# Patient Record
Sex: Female | Born: 1990 | Race: White | Hispanic: No | Marital: Single | Smoking: Former smoker
Health system: Southern US, Community
[De-identification: ages and names within clinical notes are randomized; demographics above are authoritative.]

## PROBLEM LIST (undated history)

## (undated) DIAGNOSIS — B009 Herpesviral infection, unspecified: Secondary | ICD-10-CM

## (undated) DIAGNOSIS — N730 Acute parametritis and pelvic cellulitis: Secondary | ICD-10-CM

## (undated) DIAGNOSIS — F909 Attention-deficit hyperactivity disorder, unspecified type: Secondary | ICD-10-CM

## (undated) DIAGNOSIS — R51 Headache: Secondary | ICD-10-CM

## (undated) DIAGNOSIS — R87619 Unspecified abnormal cytological findings in specimens from cervix uteri: Secondary | ICD-10-CM

## (undated) DIAGNOSIS — K8020 Calculus of gallbladder without cholecystitis without obstruction: Secondary | ICD-10-CM

## (undated) DIAGNOSIS — Z202 Contact with and (suspected) exposure to infections with a predominantly sexual mode of transmission: Secondary | ICD-10-CM

## (undated) DIAGNOSIS — R519 Headache, unspecified: Secondary | ICD-10-CM

## (undated) DIAGNOSIS — A599 Trichomoniasis, unspecified: Secondary | ICD-10-CM

## (undated) HISTORY — DX: Unspecified abnormal cytological findings in specimens from cervix uteri: R87.619

## (undated) HISTORY — DX: Headache, unspecified: R51.9

## (undated) HISTORY — DX: Contact with and (suspected) exposure to infections with a predominantly sexual mode of transmission: Z20.2

## (undated) HISTORY — DX: Headache: R51

## (undated) HISTORY — DX: Attention-deficit hyperactivity disorder, unspecified type: F90.9

## (undated) HISTORY — DX: Trichomoniasis, unspecified: A59.9

## (undated) HISTORY — DX: Calculus of gallbladder without cholecystitis without obstruction: K80.20

## (undated) HISTORY — DX: Acute parametritis and pelvic cellulitis: N73.0

## (undated) HISTORY — DX: Herpesviral infection, unspecified: B00.9

## (undated) HISTORY — PX: CLOSED REDUCTION NASAL FRACTURE: SUR256

## (undated) HISTORY — PX: WISDOM TOOTH EXTRACTION: SHX21

## (undated) HISTORY — PX: OTHER SURGICAL HISTORY: SHX169

## (undated) HISTORY — PX: HYSTEROSCOPY: SHX211

---
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
## 2012-08-23 ENCOUNTER — Encounter: Payer: Self-pay | Admitting: Certified Nurse Midwife

## 2012-08-24 ENCOUNTER — Encounter: Payer: 59 | Admitting: Certified Nurse Midwife

## 2012-08-30 ENCOUNTER — Encounter: Payer: Self-pay | Admitting: Certified Nurse Midwife

## 2012-08-30 ENCOUNTER — Ambulatory Visit (INDEPENDENT_AMBULATORY_CARE_PROVIDER_SITE_OTHER): Payer: 59 | Admitting: Certified Nurse Midwife

## 2012-08-30 VITALS — BP 104/78 | HR 68 | Resp 16 | Wt 241.0 lb

## 2012-08-30 DIAGNOSIS — N760 Acute vaginitis: Secondary | ICD-10-CM

## 2012-08-30 MED ORDER — METRONIDAZOLE 0.75 % VA GEL: 1.0000 | Freq: Every day | VAGINAL | Status: DC

## 2012-08-30 NOTE — Progress Notes (Signed)
21 y.o.Single Caucasian female G0P0000 with a 2 month history of the following:odor and vulvar itching Sexually active: yes Last sexual activity:1days ago. Pt also reports the following associated symptoms: none Patient has not tried over the counter treatment with (not applicable) relief.  No STD concerns, same partner for one year. Contraception: condoms.  Has repeated BV infections in the past two years, has decreased with monogamous relationship.  Was treated in 2-14 for BV which all symptoms resolved. Has not changed condom brand or any new personal products, limiting shaving now. No other health issues.     Exam:  ZOX:WRUEAV, Bartholin's, Urethra, Skene's normal                WUJ:[id number]: copious, watery and malodorous, pH 5.5, wet prep done                Cx:  normal appearance and non tender                Uterus:normal size, anteverted, normal shape and consistency                Adnexa: normal adnexa and no mass, fullness, tenderness  Wet Prep shows:clue cells and negative for yeast   NF:[id number] vaginosis ? Chronic Contraception: Condoms   P: Reviewed findings Discussed using baking soda to rinse with after sexual activity(external) and also using one applicator of Metrogel after menses completion each month, trial to see if no further symptoms occur. Patient would like try. Will advise after 2 months of use and treatment this time.  Stressed consistent condom use. :Vaginal antibiotic see orders. Stressed consistent condom use.  Rv prn     Reviewed, TL

## 2012-10-30 NOTE — Progress Notes (Signed)
DNKA

## 2012-12-10 ENCOUNTER — Encounter: Payer: Self-pay | Admitting: Nurse Practitioner

## 2012-12-10 ENCOUNTER — Encounter: Payer: Self-pay | Admitting: Certified Nurse Midwife

## 2012-12-10 ENCOUNTER — Ambulatory Visit (INDEPENDENT_AMBULATORY_CARE_PROVIDER_SITE_OTHER): Payer: 59 | Admitting: Nurse Practitioner

## 2012-12-10 VITALS — BP 112/62 | HR 76 | Resp 14 | Ht 66.5 in | Wt 228.2 lb

## 2012-12-10 DIAGNOSIS — N926 Irregular menstruation, unspecified: Secondary | ICD-10-CM

## 2012-12-10 DIAGNOSIS — N912 Amenorrhea, unspecified: Secondary | ICD-10-CM

## 2012-12-10 LAB — POCT URINE PREGNANCY: Preg Test, Ur: NEGATIVE

## 2012-12-10 LAB — POCT URINALYSIS DIPSTICK

## 2012-12-10 NOTE — Progress Notes (Signed)
Subjective:     Patient ID: Pamela Weber, female   DOB: [birth year], 22 y.o.   MRN: [id number]  HPI 22 yo WS Fe presents with what she considers no menses.  Her cycle in June was 6/22-27; cycle  7/18-22; and now LMP 8/19 - 23.  States this  cycle only dark brown spotting for 5 days without her normal symptoms.  Normally has cramps and heavy flow X 4 days, headaches, PMS. Menses is usually normal at 28 days.  No method of birth control for 1 year. Now ended last relationship with EX (which was 2 year relationship) 3 weeks ago. on July 31 st. Now new partner since 8/1/ and using condoms and withdrawal each time.  She is now very concerned about possible early pregnancy and if pregnant which partner is the father. She has been very stressed. Pamela Weber is with her today.   Review of Systems  Constitutional: Negative.  Negative for fever, chills and fatigue.  Respiratory: Negative.   Cardiovascular: Negative.   Gastrointestinal: Negative.  Negative for nausea, vomiting, abdominal pain, diarrhea and constipation.  Genitourinary: Negative.  Negative for dysuria, urgency, hematuria, flank pain, decreased urine volume, vaginal bleeding, vaginal discharge, vaginal pain, pelvic pain and dyspareunia.  Musculoskeletal: Positive for back pain. Negative for gait problem.  Neurological: Negative.   Psychiatric/Behavioral: Negative.        Objective:   Physical Exam  Constitutional: She is oriented to person, place, and time. She appears well-developed and well-nourished.  Patient declines exam today and denies any vaginal symptoms.  Genitourinary:  UPT negative.  Neurological: She is alert and oriented to person, place, and time.  Psychiatric: She has a normal mood and affect. Her behavior is normal. Thought content normal.       Assessment:     Recent abnormal menses most likely related to emotional stressors.    Plan:     Will do serum HCG and follow Encouraged self control and recommend to pursue  birth control options. Declined notes for work for this past week for the several days she left work early because she was upset. Did offer a note that she was here today - she declined the need for one.

## 2012-12-10 NOTE — Patient Instructions (Addendum)
We will call you with results  

## 2012-12-11 ENCOUNTER — Telehealth: Payer: Self-pay | Admitting: *Deleted

## 2012-12-11 LAB — HCG, SERUM, QUALITATIVE: Preg, Serum: NEGATIVE

## 2012-12-11 NOTE — Telephone Encounter (Signed)
LVM for pt to return my call in regards to lab results.  

## 2012-12-11 NOTE — Telephone Encounter (Signed)
Pt is aware of negative lab results.  

## 2012-12-11 NOTE — Telephone Encounter (Signed)
Message copied by Osie Bond on Tue Dec 11, 2012  8:38 AM ------      Message from: Roanna Banning      Created: Tue Dec 11, 2012  8:24 AM       Let patient know negative ------

## 2012-12-11 NOTE — Telephone Encounter (Signed)
Message copied by Osie Bond on Tue Dec 11, 2012  3:27 PM ------      Message from: Roanna Banning      Created: Tue Dec 11, 2012  8:24 AM       Let patient know negative ------

## 2012-12-12 NOTE — Progress Notes (Signed)
Encounter reviewed by Dr. Brook Silva.  

## 2013-01-07 ENCOUNTER — Encounter: Payer: Self-pay | Admitting: Certified Nurse Midwife

## 2013-01-07 ENCOUNTER — Ambulatory Visit (INDEPENDENT_AMBULATORY_CARE_PROVIDER_SITE_OTHER): Payer: 59 | Admitting: Certified Nurse Midwife

## 2013-01-07 ENCOUNTER — Encounter: Payer: Self-pay | Admitting: Obstetrics and Gynecology

## 2013-01-07 VITALS — BP 108/68 | HR 64 | Resp 16 | Ht 66.5 in | Wt 230.0 lb

## 2013-01-07 DIAGNOSIS — Z202 Contact with and (suspected) exposure to infections with a predominantly sexual mode of transmission: Secondary | ICD-10-CM

## 2013-01-07 LAB — RPR

## 2013-01-07 NOTE — Progress Notes (Signed)
21 yrs Single Caucasian female G0P0000 here for questionable STD exposure. current sexual partner thought to have history of chlamydia but he unknown confirmation of. Patient feels he is saying he might have an STD to scare her. Patient desires screen for STD's, but does not want treatment unless positive results. Last sexual activity 5 days ago.                                                                     Patient's last menstrual period was 12/24/2012.                                                                                                 Contraception:The current method of family planning is condoms consistent.                                                                                                                                                        Patient:She denies abnormal vaginal bleeding, discharge or unusual pelvic pain, no dysuria, frequency or hematuria. Denies new personal products.                                           General:Healthy female WDWN Affect: normal, orientation X 3 Abdomen: soft non-tender Lymph groin:Normal   Pelvic Exam:EGBUS normal. Vulva reveals no erythema, lesions or atrophic changes. Vagina normal, no lesions, polyps or discharge.  Cervix is normal, smooth pink mucosa, no friability, nontender, no CMT, no lesions, no polyps.  Uterus:normal non tender  Adnexae: not tender, no masses Perineal area: no lesions noted Rectal: no lesions noted Specimens collected                    Assessment: Normal Pelvic Exam                Contraception:condoms STD Screening desired ? exposure     Plan :Reviewed findings of normal pelvic exam. Lab:GC,Chl,HIV RPR            STD prevention reviewed.  Questions addressed.  Repeat  STD screen 3 months Instructions given to patient           RV prn, as above   :   :      :       Red Flags:  Missed period:  Recent antibiotics:  Possible STD exposure:  IUD:  Diabetes:

## 2013-01-08 NOTE — Progress Notes (Signed)
Note reviewed, agree with plan.  Monesha Monreal, MD  

## 2013-01-09 ENCOUNTER — Telehealth: Payer: Self-pay | Admitting: Certified Nurse Midwife

## 2013-01-09 LAB — HIV ANTIBODY (ROUTINE TESTING W REFLEX): HIV: NONREACTIVE

## 2013-01-09 NOTE — Telephone Encounter (Signed)
Patient was seen on Monday for STD's and she is starting to show signs of illness. Did not get a RX for medicine while she was here . Can you call in something.  [facility]   [address] [phone]

## 2013-01-09 NOTE — Telephone Encounter (Signed)
Patient states she is now having discharge describes as cottage cheese dc. Noticed yesterday. Requesting to be treated now for Chlamydia.  Results pending. Advised patient most likely would need to wait for results, patient persistent that she be treated at this time because of possible exposure. Please advise.

## 2013-01-10 LAB — IPS N GONORRHOEA AND CHLAMYDIA BY PCR

## 2013-01-10 NOTE — Telephone Encounter (Signed)
Pt called to get lab results, pt was told GC/Chlam both Neg, patient went on to ask if a Rx could be called In for a vaginal D/C she has been having intermittently x 3 days. (white cottage cheese d/c no itching, no odor) Please advise.

## 2013-01-10 NOTE — Telephone Encounter (Signed)
She can try Monistat OTC as she had none on exam

## 2013-01-10 NOTE — Telephone Encounter (Signed)
We discussed at length about exposure. She felt she partner was not being truthful. Increased white discharge is not a sign of Chlamydia or GC, it can be a sign of yeast or normal discharge.   Hold for culture results

## 2013-01-10 NOTE — Telephone Encounter (Signed)
Has patient called back?

## 2013-01-11 NOTE — Telephone Encounter (Signed)
Spoke with patient. Message from Pamela Weber CNM given. Patient agreeable, will try OTC Monistat 3 or 7 day and f/u if s/s not improved.

## 2013-01-11 NOTE — Telephone Encounter (Signed)
Message left to return call to nurse at [phone].   

## 2013-02-25 ENCOUNTER — Ambulatory Visit (INDEPENDENT_AMBULATORY_CARE_PROVIDER_SITE_OTHER): Payer: 59 | Admitting: Nurse Practitioner

## 2013-02-25 ENCOUNTER — Encounter: Payer: Self-pay | Admitting: Nurse Practitioner

## 2013-02-25 VITALS — BP 120/82 | HR 84 | Ht 66.5 in | Wt 235.0 lb

## 2013-02-25 DIAGNOSIS — A499 Bacterial infection, unspecified: Secondary | ICD-10-CM

## 2013-02-25 DIAGNOSIS — B9689 Other specified bacterial agents as the cause of diseases classified elsewhere: Secondary | ICD-10-CM

## 2013-02-25 DIAGNOSIS — N760 Acute vaginitis: Secondary | ICD-10-CM

## 2013-02-25 DIAGNOSIS — Z7251 High risk heterosexual behavior: Secondary | ICD-10-CM

## 2013-02-25 MED ORDER — METRONIDAZOLE 0.75 % VA GEL: 1.0000 | Freq: Every day | VAGINAL | Status: DC

## 2013-02-25 MED ORDER — METRONIDAZOLE 500 MG PO TABS: 500.0000 mg | ORAL_TABLET | Freq: Two times a day (BID) | ORAL | Status: DC

## 2013-02-25 NOTE — Progress Notes (Signed)
Subjective:     Patient ID: Pamela Weber, female   DOB: 02/21/1991, 22 y.o.   MRN: [id number]  HPI this 22 yo WS Fe complains of years vaginal symptoms. Has history of chronic BV.  Not dating, but sexually active with 3 different partners this month.  State she is living the 'single life'.  Using condoms with 2 partners, but not the third partner. Not on OCP.  Implanon removed one year ago secondary to complications of spotting and headaches.  Menses currently are regular.  Lasting 4 days with cramps. This last cycle maybe a little off and more flow.  Request a UPT.  Denies urinary symptoms.   Review of Systems  Constitutional: Negative for fever, chills and fatigue.  Respiratory: Negative.   Cardiovascular: Negative.   Gastrointestinal: Negative for nausea, vomiting, abdominal pain, diarrhea, constipation and rectal pain.  Genitourinary: Positive for vaginal discharge. Negative for dysuria, urgency, frequency, flank pain, pelvic pain and dyspareunia.       History of BV infections frequently. Was diagnosed with chlamydia a few months ago and coming back in December for TOC.  Declines STD testing today.  Skin: Negative.   Neurological: Negative.   Psychiatric/Behavioral: Negative.    UPT negative,  UA few WBC    Objective:   Physical Exam  Constitutional: She is oriented to person, place, and time. She appears well-developed and well-nourished. She appears distressed.  Abdominal: Soft. She exhibits no distension. There is no tenderness. There is no rebound and no guarding.  Genitourinary:  White thin watery vaginal discharge. Last SA last pm so feels a little 'sore'. No pain on bimanual exam.  Wet prep:  PH 5.0; KOH: negative; NSS: + clue cells.  Neurological: She is alert and oriented to person, place, and time.  Psychiatric: She has a normal mood and affect. Her behavior is normal. Judgment and thought content normal.       Assessment:   Bacterial Vaginitis STD risk with risky  behaviors Recent diagnosis of chlamydia - for TOC in December    Plan:     Discussed risky sexual practices Strongly advised condoms each time with every partner She initially wanted Metrogel - then changed to Flagyl 500 mg BID for a week. She was given - precaution with use of ETOH and potential GI side effects.    She has Metrogel on hand at home that she uses for use after SA.

## 2013-02-25 NOTE — Patient Instructions (Signed)
Bacterial Vaginosis Bacterial vaginosis (BV) is a vaginal infection where the normal balance of bacteria in the vagina is disrupted. The normal balance is then replaced by an overgrowth of certain bacteria. There are several different kinds of bacteria that can cause BV. BV is the most common vaginal infection in women of childbearing age. CAUSES   The cause of BV is not fully understood. BV develops when there is an increase or imbalance of harmful bacteria.  Some activities or behaviors can upset the normal balance of bacteria in the vagina and put women at increased risk including:  Having a new sex partner or multiple sex partners.  Douching.  Using an intrauterine device (IUD) for contraception.  It is not clear what role sexual activity plays in the development of BV. However, women that have never had sexual intercourse are rarely infected with BV. Women do not get BV from toilet seats, bedding, swimming pools or from touching objects around them.  SYMPTOMS   Grey vaginal discharge.  A fish-like odor with discharge, especially after sexual intercourse.  Itching or burning of the vagina and vulva.  Burning or pain with urination.  Some women have no signs or symptoms at all. DIAGNOSIS  Your caregiver must examine the vagina for signs of BV. Your caregiver will perform lab tests and look at the sample of vaginal fluid through a microscope. They will look for bacteria and abnormal cells (clue cells), a pH test higher than 4.5, and a positive amine test all associated with BV.  RISKS AND COMPLICATIONS   Pelvic inflammatory disease (PID).  Infections following gynecology surgery.  Developing HIV.  Developing herpes virus. TREATMENT  Sometimes BV will clear up without treatment. However, all women with symptoms of BV should be treated to avoid complications, especially if gynecology surgery is planned. Female partners generally do not need to be treated. However, BV may spread  between female sex partners so treatment is helpful in preventing a recurrence of BV.   BV may be treated with antibiotics. The antibiotics come in either pill or vaginal cream forms. Either can be used with nonpregnant or pregnant women, but the recommended dosages differ. These antibiotics are not harmful to the baby.  BV can recur after treatment. If this happens, a second round of antibiotics will often be prescribed.  Treatment is important for pregnant women. If not treated, BV can cause a premature delivery, especially for a pregnant woman who had a premature birth in the past. All pregnant women who have symptoms of BV should be checked and treated.  For chronic reoccurrence of BV, treatment with a type of prescribed gel vaginally twice a week is helpful. HOME CARE INSTRUCTIONS   Finish all medication as directed by your caregiver.  Do not have sex until treatment is completed.  Tell your sexual partner that you have a vaginal infection. They should see their caregiver and be treated if they have problems, such as a mild rash or itching.  Practice safe sex. Use condoms. Only have 1 sex partner. PREVENTION  Basic prevention steps can help reduce the risk of upsetting the natural balance of bacteria in the vagina and developing BV:  Do not have sexual intercourse (be abstinent).  Do not douche.  Use all of the medicine prescribed for treatment of BV, even if the signs and symptoms go away.  Tell your sex partner if you have BV. That way, they can be treated, if needed, to prevent reoccurrence. SEEK MEDICAL CARE IF:     Your symptoms are not improving after 3 days of treatment.  You have increased discharge, pain, or fever. MAKE SURE YOU:   Understand these instructions.  Will watch your condition.  Will get help right away if you are not doing well or get worse. FOR MORE INFORMATION  Division of STD Prevention (DSTDP), Centers for Disease Control and Prevention:  www.cdc.gov/std American Social Health Association (ASHA): www.ashastd.org  Document Released: 04/11/2005 Document Revised: 07/04/2011 Document Reviewed: 10/02/2008 Exit[employer] Patient Information 2014 [employer].  

## 2013-02-27 NOTE — Progress Notes (Signed)
Encounter reviewed by Dr. Tacoya Altizer Silva.  

## 2013-02-28 ENCOUNTER — Other Ambulatory Visit: Payer: Self-pay

## 2013-04-08 ENCOUNTER — Ambulatory Visit: Payer: 59 | Admitting: Certified Nurse Midwife

## 2013-04-08 ENCOUNTER — Telehealth: Payer: Self-pay | Admitting: Certified Nurse Midwife

## 2013-04-08 NOTE — Telephone Encounter (Signed)
Left message  regarding missed appointment. Mom says pt has not been taking her adderall and may have forgotten about appointment. She will have patient call.

## 2013-12-31 ENCOUNTER — Encounter (HOSPITAL_COMMUNITY): Payer: Self-pay | Admitting: Emergency Medicine

## 2013-12-31 ENCOUNTER — Emergency Department (HOSPITAL_COMMUNITY): Payer: BC Managed Care – PPO

## 2013-12-31 ENCOUNTER — Emergency Department (HOSPITAL_COMMUNITY)
Admission: EM | Admit: 2013-12-31 | Discharge: 2014-01-01 | Disposition: A | Discharge status: Home / Self Care | Payer: BC Managed Care – PPO | Attending: Emergency Medicine | Admitting: Emergency Medicine

## 2013-12-31 DIAGNOSIS — Z8619 Personal history of other infectious and parasitic diseases: Secondary | ICD-10-CM | POA: Diagnosis not present

## 2013-12-31 DIAGNOSIS — R1012 Left upper quadrant pain: Secondary | ICD-10-CM | POA: Diagnosis present

## 2013-12-31 DIAGNOSIS — K8000 Calculus of gallbladder with acute cholecystitis without obstruction: Secondary | ICD-10-CM | POA: Insufficient documentation

## 2013-12-31 DIAGNOSIS — Z79899 Other long term (current) drug therapy: Secondary | ICD-10-CM | POA: Diagnosis not present

## 2013-12-31 DIAGNOSIS — K8020 Calculus of gallbladder without cholecystitis without obstruction: Secondary | ICD-10-CM

## 2013-12-31 DIAGNOSIS — F17200 Nicotine dependence, unspecified, uncomplicated: Secondary | ICD-10-CM | POA: Insufficient documentation

## 2013-12-31 LAB — COMPREHENSIVE METABOLIC PANEL
ALT: 13 U/L (ref 0–35)
ANION GAP: 12 (ref 5–15)
AST: 14 U/L (ref 0–37)
Albumin: 4.1 g/dL (ref 3.5–5.2)
Alkaline Phosphatase: 58 U/L (ref 39–117)
BILIRUBIN TOTAL: 0.3 mg/dL (ref 0.3–1.2)
BUN: 11 mg/dL (ref 6–23)
CO2: 23 meq/L (ref 19–32)
Calcium: 9.4 mg/dL (ref 8.4–10.5)
Chloride: 104 mEq/L (ref 96–112)
Creatinine, Ser: 0.99 mg/dL (ref 0.50–1.10)
GFR calc non Af Amer: 80 mL/min — ABNORMAL LOW (ref >90)
GLUCOSE: 96 mg/dL (ref 70–99)
POTASSIUM: 3.8 meq/L (ref 3.7–5.3)
SODIUM: 139 meq/L (ref 137–147)
TOTAL PROTEIN: 7.3 g/dL (ref 6.0–8.3)

## 2013-12-31 LAB — URINALYSIS, ROUTINE W REFLEX MICROSCOPIC
GLUCOSE, UA: NEGATIVE mg/dL
Hgb urine dipstick: NEGATIVE
KETONES UR: 15 mg/dL — AB
Nitrite: NEGATIVE
PH: 5.5 (ref 5.0–8.0)
PROTEIN: 30 mg/dL — AB
SPECIFIC GRAVITY, URINE: 1.036 — AB (ref 1.005–1.030)
Urobilinogen, UA: 1 mg/dL (ref 0.0–1.0)

## 2013-12-31 LAB — CBC WITH DIFFERENTIAL/PLATELET
BASOS ABS: 0 10*3/uL (ref 0.0–0.1)
Basophils Relative: 0 % (ref 0–1)
Eosinophils Absolute: 0.1 10*3/uL (ref 0.0–0.7)
Eosinophils Relative: 1 % (ref 0–5)
HEMATOCRIT: 40.4 % (ref 36.0–46.0)
HEMOGLOBIN: 14.7 g/dL (ref 12.0–15.0)
LYMPHS ABS: 1.7 10*3/uL (ref 0.7–4.0)
Lymphocytes Relative: 27 % (ref 12–46)
MCH: 31 pg (ref 26.0–34.0)
MCHC: 36.4 g/dL — AB (ref 30.0–36.0)
MCV: 85.2 fL (ref 78.0–100.0)
MONO ABS: 0.4 10*3/uL (ref 0.1–1.0)
Monocytes Relative: 6 % (ref 3–12)
NEUTROS PCT: 66 % (ref 43–77)
Neutro Abs: 4 10*3/uL (ref 1.7–7.7)
PLATELETS: 268 10*3/uL (ref 150–400)
RBC: 4.74 MIL/uL (ref 3.87–5.11)
RDW: 11.5 % (ref 11.5–15.5)
WBC: 6.1 10*3/uL (ref 4.0–10.5)

## 2013-12-31 LAB — URINE MICROSCOPIC-ADD ON

## 2013-12-31 LAB — LIPASE, BLOOD: LIPASE: 16 U/L (ref 11–59)

## 2013-12-31 MED ORDER — GI COCKTAIL ~~LOC~~
30.0000 mL | Freq: Once | ORAL | Status: AC
  Administered 2013-12-31: 30 mL via ORAL
  Filled 2013-12-31: qty 30

## 2013-12-31 NOTE — ED Notes (Signed)
PA at bedside.

## 2013-12-31 NOTE — ED Provider Notes (Signed)
CSN: [id number]     Arrival date & time 12/31/13  1553 History   First MD Initiated Contact with Patient 12/31/13 2012     Chief Complaint  Patient presents with  . Abdominal Pain     (Consider location/radiation/quality/duration/timing/severity/associated sxs/prior Treatment) Patient is a 23 y.o. female presenting with abdominal pain. The history is provided by the patient. No language interpreter was used.  Abdominal Pain Pain location:  Epigastric, LUQ and RUQ Pain quality: aching, cramping and squeezing   Pain radiates to:  Does not radiate Associated symptoms: no chills, no constipation, no diarrhea, no dysuria, no fever, no nausea and no vomiting   Associated symptoms comment:  Pain for the past 4 days that is intermittent, cramping in nature and aggravated by movement/position. She has had no nausea or vomiting. No change in bowel movements. She does not associate the pain as worse with eating but also reports she has not felt like eating today. No fever.    Past Medical History  Diagnosis Date  . HSV-1 (herpes simplex virus 1) infection   . Chlamydia contact, treated   . Trichimoniasis 11/09   Past Surgical History  Procedure Laterality Date  . Implanon      removed 12/07/11   Family History  Problem Relation Age of Onset  . Diabetes Maternal Grandmother    History  Substance Use Topics  . Smoking status: Current Every Day Smoker -- 0.50 packs/day    Types: Cigarettes  . Smokeless tobacco: Not on file  . Alcohol Use: No   OB History   Grav Para Term Preterm Abortions TAB SAB Ect Mult Living   0 0             Review of Systems  Constitutional: Negative for fever and chills.  HENT: Negative.   Respiratory: Negative.   Cardiovascular: Negative.   Gastrointestinal: Positive for abdominal pain. Negative for nausea, vomiting, diarrhea and constipation.  Genitourinary: Negative.  Negative for dysuria.  Musculoskeletal: Negative.   Skin: Negative.   Neurological:  Negative.       Allergies  Review of patient's allergies indicates no known allergies.  Home Medications   Prior to Admission medications   Medication Sig Start Date End Date Taking? Authorizing Provider  amphetamine-dextroamphetamine (ADDERALL XR) 20 MG 24 hr capsule Take 20 mg by mouth daily.   Yes Historical Provider, MD  Hydrocodone-Acetaminophen (VICODIN PO) Take 1 tablet by mouth daily as needed. For pain   Yes Historical Provider, MD  ibuprofen (ADVIL,MOTRIN) 200 MG tablet Take 200 mg by mouth every 6 (six) hours as needed.   Yes Historical Provider, MD   BP 118/70  Pulse 97  Temp(Src) 98.3 F (36.8 C) (Oral)  Resp 18  SpO2 100%  LMP 11/30/2013 Physical Exam  Constitutional: She is oriented to person, place, and time. She appears well-developed and well-nourished.  HENT:  Head: Normocephalic.  Neck: Normal range of motion. Neck supple.  Cardiovascular: Normal rate and regular rhythm.   Pulmonary/Chest: Effort normal and breath sounds normal.  Abdominal: Soft. Bowel sounds are normal. There is tenderness. There is no rebound and no guarding.  Tenderness of upper abdomen, greater over LUQ.  Musculoskeletal: Normal range of motion.  Neurological: She is alert and oriented to person, place, and time.  Skin: Skin is warm and dry. No rash noted.  Psychiatric: She has a normal mood and affect.    ED Course  Procedures (including critical care time) Labs Review Labs Reviewed  CBC WITH DIFFERENTIAL -  Abnormal; Notable for the following:    MCHC 36.4 (*)    All other components within normal limits  COMPREHENSIVE METABOLIC PANEL - Abnormal; Notable for the following:    GFR calc non Af Amer 80 (*)    All other components within normal limits  URINALYSIS, ROUTINE W REFLEX MICROSCOPIC - Abnormal; Notable for the following:    Color, Urine AMBER (*)    APPearance CLOUDY (*)    Specific Gravity, Urine 1.036 (*)    Bilirubin Urine SMALL (*)    Ketones, ur 15 (*)     Protein, ur 30 (*)    Leukocytes, UA MODERATE (*)    All other components within normal limits  URINE MICROSCOPIC-ADD ON - Abnormal; Notable for the following:    Squamous Epithelial / LPF MANY (*)    Bacteria, UA MANY (*)    All other components within normal limits  LIPASE, BLOOD  POC URINE PREG, ED   Results for orders placed during the hospital encounter of 12/31/13  CBC WITH DIFFERENTIAL      Result Value Ref Range   WBC 6.1  4.0 - 10.5 K/uL   RBC 4.74  3.87 - 5.11 MIL/uL   Hemoglobin 14.7  12.0 - 15.0 g/dL   HCT 16.1  09.6 - 04.5 %   MCV 85.2  78.0 - 100.0 fL   MCH 31.0  26.0 - 34.0 pg   MCHC 36.4 (*) 30.0 - 36.0 g/dL   RDW 40.9  81.1 - 91.4 %   Platelets 268  150 - 400 K/uL   Neutrophils Relative % 66  43 - 77 %   Neutro Abs 4.0  1.7 - 7.7 K/uL   Lymphocytes Relative 27  12 - 46 %   Lymphs Abs 1.7  0.7 - 4.0 K/uL   Monocytes Relative 6  3 - 12 %   Monocytes Absolute 0.4  0.1 - 1.0 K/uL   Eosinophils Relative 1  0 - 5 %   Eosinophils Absolute 0.1  0.0 - 0.7 K/uL   Basophils Relative 0  0 - 1 %   Basophils Absolute 0.0  0.0 - 0.1 K/uL  COMPREHENSIVE METABOLIC PANEL      Result Value Ref Range   Sodium 139  137 - 147 mEq/L   Potassium 3.8  3.7 - 5.3 mEq/L   Chloride 104  96 - 112 mEq/L   CO2 23  19 - 32 mEq/L   Glucose, Bld 96  70 - 99 mg/dL   BUN 11  6 - 23 mg/dL   Creatinine, Ser 7.82  0.50 - 1.10 mg/dL   Calcium 9.4  8.4 - 95.6 mg/dL   Total Protein 7.3  6.0 - 8.3 g/dL   Albumin 4.1  3.5 - 5.2 g/dL   AST 14  0 - 37 U/L   ALT 13  0 - 35 U/L   Alkaline Phosphatase 58  39 - 117 U/L   Total Bilirubin 0.3  0.3 - 1.2 mg/dL   GFR calc non Af Amer 80 (*) >90 mL/min   GFR calc Af Amer >90  >90 mL/min   Anion gap 12  5 - 15  LIPASE, BLOOD      Result Value Ref Range   Lipase 16  11 - 59 U/L  URINALYSIS, ROUTINE W REFLEX MICROSCOPIC      Result Value Ref Range   Color, Urine AMBER (*) YELLOW   APPearance CLOUDY (*) CLEAR   Specific Gravity, Urine 1.036 (*)  1.005  - 1.030   pH 5.5  5.0 - 8.0   Glucose, UA NEGATIVE  NEGATIVE mg/dL   Hgb urine dipstick NEGATIVE  NEGATIVE   Bilirubin Urine SMALL (*) NEGATIVE   Ketones, ur 15 (*) NEGATIVE mg/dL   Protein, ur 30 (*) NEGATIVE mg/dL   Urobilinogen, UA 1.0  0.0 - 1.0 mg/dL   Nitrite NEGATIVE  NEGATIVE   Leukocytes, UA MODERATE (*) NEGATIVE  URINE MICROSCOPIC-ADD ON      Result Value Ref Range   Squamous Epithelial / LPF MANY (*) RARE   WBC, UA 7-10  <3 WBC/hpf   RBC / HPF 0-2  <3 RBC/hpf   Bacteria, UA MANY (*) RARE   Urine-Other MUCOUS PRESENT     US Abdomen Complete  12/31/2013   CLINICAL DATA:  Abdominal pain, nausea and vomiting for 4 days.  EXAM: ULTRASOUND ABDOMEN COMPLETE  COMPARISON:  None.  FINDINGS: Gallbladder:  There is a large (approximately 1.9 cm) echogenic shadowing stone (images 37 and 44) within otherwise normal-appearing gallbladder. No gallbladder wall thickening or pericholecystic fluid. Negative sonographic Murphy's sign.  Common bile duct:  Diameter: Normal in size measuring 2.4 mm in diameter  Liver:  There is mild diffuse increased echogenicity of the hepatic parenchyma suggestive of hepatic steatosis. No discrete hepatic lesions. No intrahepatic biliary duct dilatation. No ascites.  IVC:  No abnormality visualized.  Pancreas:  Limited visualization of the pancreatic head and neck is normal. Visualization of the pancreatic body and tail is obscured by bowel gas.  Spleen:  Normal in size measuring 8.4 cm in length  Right Kidney:  Normal cortical thickness, echogenicity and size, measuring 9.6 cm in length. No focal renal lesions. No echogenic renal stones. No urinary obstruction.  Left Kidney:  Normal cortical thickness, echogenicity and size, measuring 10.8 cm in length. No focal renal lesions. No echogenic renal stones. No urinary obstruction.  Abdominal aorta:  No aneurysm visualized.  Other findings:  None.  IMPRESSION: 1. Cholelithiasis without evidence of cholecystitis. If concern  persists for acute cholecystitis, further evaluation could be performed with a HIDA scan as clinically indicated. 2. No evidence of urinary obstruction. 3. Suspected hepatic steatosis.   Electronically Signed   By: Simonne Come M.D.   On: 12/31/2013 22:14    Imaging Review No results found.   EKG Interpretation None      MDM   Final diagnoses:  None    1. Cholelithiasis  No leukocytosis, fever and no evidence of cholecystitis on [place]. Patient appears stable, talking on phone. No vomiting. She appears stable for discharge and will be referred for outpatient surgical evaluation of cholecystitis.    Arnoldo Hooker, PA-C 12/31/13 [id number]

## 2013-12-31 NOTE — ED Notes (Signed)
Pt reports generalized "sharp" abdominal pain x 4 days with N/V. Denies urinary symptoms. Denies vaginal symptoms. Pt in NAD. AO x4.

## 2014-01-01 ENCOUNTER — Telehealth (INDEPENDENT_AMBULATORY_CARE_PROVIDER_SITE_OTHER): Payer: Self-pay

## 2014-01-01 MED ORDER — ONDANSETRON HCL 4 MG PO TABS: 4.0000 mg | ORAL_TABLET | Freq: Four times a day (QID) | ORAL | Status: DC

## 2014-01-01 MED ORDER — HYDROCODONE-ACETAMINOPHEN 5-325 MG PO TABS: 1.0000 | ORAL_TABLET | ORAL | Status: DC | PRN

## 2014-01-01 NOTE — Telephone Encounter (Signed)
Incoming call from Pamela Weber.  She states she was seen in ED today for her gallbladder and is waiting to hear from our office with an appointment for surgical evaluation.  Pamela Weber states she has developed diarrhea every 30 minutes now and would like to know what medication she can take for this.  Advised Pamela Weber that she needs to contact the ED that she was recently evaluated and ask to speak to triage nurse to see what recommendations the attending physician may have since she has not been seen at our office.  Pamela Weber verbalized understanding and will contact ED for further medical advice.

## 2014-01-01 NOTE — Discharge Instructions (Signed)
Cholelithiasis °Cholelithiasis (also called gallstones) is a form of gallbladder disease in which gallstones form in your gallbladder. The gallbladder is an organ that stores bile made in the liver, which helps digest fats. Gallstones begin as small crystals and slowly grow into stones. Gallstone pain occurs when the gallbladder spasms and a gallstone is blocking the duct. Pain can also occur when a stone passes out of the duct.  °RISK FACTORS °· Being female.   °· Having multiple pregnancies. Health care providers sometimes advise removing diseased gallbladders before future pregnancies.   °· Being obese. °· Eating a diet heavy in fried foods and fat.   °· Being older than 60 years and increasing age.   °· Prolonged use of medicines containing female hormones.   °· Having diabetes mellitus.   °· Rapidly losing weight.   °· Having a family history of gallstones (heredity).   °SYMPTOMS °· Nausea.   °· Vomiting. °· Abdominal pain.   °· Yellowing of the skin (jaundice).   °· Sudden pain. It may persist from several minutes to several hours. °· Fever.   °· Tenderness to the touch.  °In some cases, when gallstones do not move into the bile duct, people have no pain or symptoms. These are called "silent" gallstones.  °TREATMENT °Silent gallstones do not need treatment. In severe cases, emergency surgery may be required. Options for treatment include: °· Surgery to remove the gallbladder. This is the most common treatment. °· Medicines. These do not always work and may take 6-12 months or more to work. °· Shock wave treatment (extracorporeal biliary lithotripsy). In this treatment an ultrasound machine sends shock waves to the gallbladder to break gallstones into smaller pieces that can pass into the intestines or be dissolved by medicine. °HOME CARE INSTRUCTIONS  °· Only take over-the-counter or prescription medicines for pain, discomfort, or fever as directed by your health care provider.   °· Follow a low-fat diet until  seen again by your health care provider. Fat causes the gallbladder to contract, which can result in pain.   °· Follow up with your health care provider as directed. Attacks are almost always recurrent and surgery is usually required for permanent treatment.   °SEEK IMMEDIATE MEDICAL CARE IF:  °· Your pain increases and is not controlled by medicines.   °· You have a fever or persistent symptoms for more than 2-3 days.   °· You have a fever and your symptoms suddenly get worse.   °· You have persistent nausea and vomiting.   °MAKE SURE YOU:  °· Understand these instructions. °· Will watch your condition. °· Will get help right away if you are not doing well or get worse. °Document Released: 04/07/2005 Document Revised: 12/12/2012 Document Reviewed: 10/03/2012 °ExitCare® Patient Information ©2015 [employer]. This information is not intended to replace advice given to you by your health care provider. Make sure you discuss any questions you have with your health care provider. ° °

## 2014-01-01 NOTE — ED Notes (Signed)
Pt A&Ox4, ambulatory at d/c with steady gait, NAD 

## 2014-01-02 NOTE — ED Provider Notes (Signed)
Medical screening examination/treatment/procedure(s) were performed by non-physician practitioner and as supervising physician I was immediately available for consultation/collaboration.   EKG Interpretation None       Arby Barrette, MD 01/02/14 [phone]

## 2014-01-04 ENCOUNTER — Encounter (HOSPITAL_COMMUNITY): Payer: Self-pay | Admitting: Emergency Medicine

## 2014-01-04 ENCOUNTER — Emergency Department (HOSPITAL_COMMUNITY)
Admission: EM | Admit: 2014-01-04 | Discharge: 2014-01-04 | Disposition: A | Discharge status: Home / Self Care | Payer: BC Managed Care – PPO | Attending: Emergency Medicine | Admitting: Emergency Medicine

## 2014-01-04 DIAGNOSIS — R1011 Right upper quadrant pain: Secondary | ICD-10-CM | POA: Diagnosis present

## 2014-01-04 DIAGNOSIS — R110 Nausea: Secondary | ICD-10-CM | POA: Diagnosis not present

## 2014-01-04 DIAGNOSIS — Z8619 Personal history of other infectious and parasitic diseases: Secondary | ICD-10-CM | POA: Insufficient documentation

## 2014-01-04 DIAGNOSIS — F17200 Nicotine dependence, unspecified, uncomplicated: Secondary | ICD-10-CM | POA: Diagnosis not present

## 2014-01-04 DIAGNOSIS — Z79899 Other long term (current) drug therapy: Secondary | ICD-10-CM | POA: Diagnosis not present

## 2014-01-04 NOTE — ED Notes (Signed)
Patient discharged using teach back method verbalizes an understanding, NAD noted at the time of D/C patient states she was here for a doctors note to remain out of work until she can see a Careers adviser.

## 2014-01-04 NOTE — ED Provider Notes (Signed)
CSN: [id number]     Arrival date & time 01/04/14  0732 History   First MD Initiated Contact with Patient 01/04/14 0802     Chief Complaint  Patient presents with  . Abdominal Pain     (Consider location/radiation/quality/duration/timing/severity/associated sxs/prior Treatment) HPI  Pamela Weber 23 year old female who presents to the emergency department in need of a work note. Patient was diagnosed with cholelithiasis this past Tuesday. She has a scheduled appointment this coming Wednesday at [facility] surgery. Patient states she needs a note for work for this weekend until her temporary disability kicks in on Monday. She states that her pain in the right upper quadrant is worse when she is up and ambulating however if she is at rest she feels fair. She denies intractable nausea, vomiting or fever.   Past Medical History  Diagnosis Date  . HSV-1 (herpes simplex virus 1) infection   . Chlamydia contact, treated   . Trichimoniasis 11/09   Past Surgical History  Procedure Laterality Date  . Implanon      removed 12/07/11   Family History  Problem Relation Age of Onset  . Diabetes Maternal Grandmother    History  Substance Use Topics  . Smoking status: Current Every Day Smoker -- 0.50 packs/day    Types: Cigarettes  . Smokeless tobacco: Not on file  . Alcohol Use: No   OB History   Grav Para Term Preterm Abortions TAB SAB Ect Mult Living   0 0             Review of Systems  Constitutional: Negative for fever.  Gastrointestinal: Positive for nausea and abdominal pain.  All other systems reviewed and are negative.     Allergies  Review of patient's allergies indicates no known allergies.  Home Medications   Prior to Admission medications   Medication Sig Start Date End Date Taking? Authorizing Provider  amphetamine-dextroamphetamine (ADDERALL XR) 20 MG 24 hr capsule Take 20 mg by mouth daily.    Historical Provider, MD  HYDROcodone-acetaminophen  (NORCO/VICODIN) 5-325 MG per tablet Take 1-2 tablets by mouth every 4 (four) hours as needed. 01/01/14   Shari A Upstill, PA-C  Hydrocodone-Acetaminophen (VICODIN PO) Take 1 tablet by mouth daily as needed. For pain    Historical Provider, MD  ibuprofen (ADVIL,MOTRIN) 200 MG tablet Take 200 mg by mouth every 6 (six) hours as needed.    Historical Provider, MD  ondansetron (ZOFRAN) 4 MG tablet Take 1 tablet (4 mg total) by mouth every 6 (six) hours. 01/01/14   Shari A Upstill, PA-C   BP 117/65  Pulse 92  Temp(Src) 98 F (36.7 C) (Oral)  Ht  (1.702 m)  Wt 215 lb 6 oz (97.693 kg)  BMI 33.72 kg/m2  SpO2 98%  LMP 11/30/2013 Physical Exam  Nursing note and vitals reviewed. Constitutional: She is oriented to person, place, and time. She appears well-developed and well-nourished. No distress.  HENT:  Head: Normocephalic and atraumatic.  Eyes: Conjunctivae are normal. No scleral icterus.  Neck: Normal range of motion.  Cardiovascular: Normal rate, regular rhythm and normal heart sounds.  Exam reveals no gallop and no friction rub.   No murmur heard. Pulmonary/Chest: Effort normal and breath sounds normal. No respiratory distress.  Abdominal: Soft. Bowel sounds are normal. She exhibits no distension and no mass. There is tenderness (Right upper quadrant). There is no guarding.  Neurological: She is alert and oriented to person, place, and time.  Skin: Skin is warm and dry.  She is not diaphoretic.    ED Course  Procedures (including critical care time) Labs Review Labs Reviewed - No data to display  Imaging Review No results found.   EKG Interpretation None      MDM   Final diagnoses:  Right upper quadrant pain    Patient here in need of a work note. She appears to be doing well. She is able to tolerate foods and fluids. If further workup is necessary at this time. Will discharge with a work note.   Arthor Captain, PA-C 01/04/14 [phone]

## 2014-01-04 NOTE — ED Notes (Signed)
Pt. Stated, I need a note for work cause i can't work til I have them out

## 2014-01-04 NOTE — ED Provider Notes (Signed)
Medical screening examination/treatment/procedure(s) were performed by non-physician practitioner and as supervising physician I was immediately available for consultation/collaboration.   EKG Interpretation None        Hallee Mckenny, MD 01/04/14 1617 

## 2014-01-04 NOTE — Discharge Instructions (Signed)
Abdominal Pain, Women °Abdominal (stomach, pelvic, or belly) pain can be caused by many things. It is important to tell your doctor: °· The location of the pain. °· Does it come and go or is it present all the time? °· Are there things that start the pain (eating certain foods, exercise)? °· Are there other symptoms associated with the pain (fever, nausea, vomiting, diarrhea)? °All of this is helpful to know when trying to find the cause of the pain. °CAUSES  °· Stomach: virus or bacteria infection, or ulcer. °· Intestine: appendicitis (inflamed appendix), regional ileitis (Crohn's disease), ulcerative colitis (inflamed colon), irritable bowel syndrome, diverticulitis (inflamed diverticulum of the colon), or cancer of the stomach or intestine. °· Gallbladder disease or stones in the gallbladder. °· Kidney disease, kidney stones, or infection. °· Pancreas infection or cancer. °· Fibromyalgia (pain disorder). °· Diseases of the female organs: °¨ Uterus: fibroid (non-cancerous) tumors or infection. °¨ Fallopian tubes: infection or tubal pregnancy. °¨ Ovary: cysts or tumors. °¨ Pelvic adhesions (scar tissue). °¨ Endometriosis (uterus lining tissue growing in the pelvis and on the pelvic organs). °¨ Pelvic congestion syndrome (female organs filling up with blood just before the menstrual period). °¨ Pain with the menstrual period. °¨ Pain with ovulation (producing an egg). °¨ Pain with an IUD (intrauterine device, birth control) in the uterus. °¨ Cancer of the female organs. °· Functional pain (pain not caused by a disease, may improve without treatment). °· Psychological pain. °· Depression. °DIAGNOSIS  °Your doctor will decide the seriousness of your pain by doing an examination. °· Blood tests. °· X-rays. °· Ultrasound. °· CT scan (computed tomography, special type of X-ray). °· MRI (magnetic resonance imaging). °· Cultures, for infection. °· Barium enema (dye inserted in the large intestine, to better view it with  X-rays). °· Colonoscopy (looking in intestine with a lighted tube). °· Laparoscopy (minor surgery, looking in abdomen with a lighted tube). °· Major abdominal exploratory surgery (looking in abdomen with a large incision). °TREATMENT  °The treatment will depend on the cause of the pain.  °· Many cases can be observed and treated at home. °· Over-the-counter medicines recommended by your caregiver. °· Prescription medicine. °· Antibiotics, for infection. °· Birth control pills, for painful periods or for ovulation pain. °· Hormone treatment, for endometriosis. °· Nerve blocking injections. °· Physical therapy. °· Antidepressants. °· Counseling with a psychologist or psychiatrist. °· Minor or major surgery. °HOME CARE INSTRUCTIONS  °· Do not take laxatives, unless directed by your caregiver. °· Take over-the-counter pain medicine only if ordered by your caregiver. Do not take aspirin because it can cause an upset stomach or bleeding. °· Try a clear liquid diet (broth or water) as ordered by your caregiver. Slowly move to a bland diet, as tolerated, if the pain is related to the stomach or intestine. °· Have a thermometer and take your temperature several times a day, and record it. °· Bed rest and sleep, if it helps the pain. °· Avoid sexual intercourse, if it causes pain. °· Avoid stressful situations. °· Keep your follow-up appointments and tests, as your caregiver orders. °· If the pain does not go away with medicine or surgery, you may try: °¨ Acupuncture. °¨ Relaxation exercises (yoga, meditation). °¨ Group therapy. °¨ Counseling. °SEEK MEDICAL CARE IF:  °· You notice certain foods cause stomach pain. °· Your home care treatment is not helping your pain. °· You need stronger pain medicine. °· You want your IUD removed. °· You feel faint or   lightheaded. °· You develop nausea and vomiting. °· You develop a rash. °· You are having side effects or an allergy to your medicine. °SEEK IMMEDIATE MEDICAL CARE IF:  °· Your  pain does not go away or gets worse. °· You have a fever. °· Your pain is felt only in portions of the abdomen. The right side could possibly be appendicitis. The left lower portion of the abdomen could be colitis or diverticulitis. °· You are passing blood in your stools (bright red or black tarry stools, with or without vomiting). °· You have blood in your urine. °· You develop chills, with or without a fever. °· You pass out. °MAKE SURE YOU:  °· Understand these instructions. °· Will watch your condition. °· Will get help right away if you are not doing well or get worse. °Document Released: 02/06/2007 Document Revised: 08/26/2013 Document Reviewed: 02/26/2009 °ExitCare® Patient Information ©2015 [employer]. This information is not intended to replace advice given to you by your health care provider. Make sure you discuss any questions you have with your health care provider. ° °

## 2014-01-21 ENCOUNTER — Emergency Department (HOSPITAL_COMMUNITY)
Admission: EM | Admit: 2014-01-21 | Discharge: 2014-01-21 | Disposition: A | Discharge status: Home / Self Care | Payer: BC Managed Care – PPO | Attending: Emergency Medicine | Admitting: Emergency Medicine

## 2014-01-21 ENCOUNTER — Encounter (HOSPITAL_COMMUNITY): Payer: Self-pay | Admitting: Emergency Medicine

## 2014-01-21 DIAGNOSIS — R638 Other symptoms and signs concerning food and fluid intake: Secondary | ICD-10-CM | POA: Diagnosis not present

## 2014-01-21 DIAGNOSIS — Z3202 Encounter for pregnancy test, result negative: Secondary | ICD-10-CM | POA: Insufficient documentation

## 2014-01-21 DIAGNOSIS — R109 Unspecified abdominal pain: Secondary | ICD-10-CM | POA: Insufficient documentation

## 2014-01-21 DIAGNOSIS — F17200 Nicotine dependence, unspecified, uncomplicated: Secondary | ICD-10-CM | POA: Diagnosis not present

## 2014-01-21 DIAGNOSIS — Z8619 Personal history of other infectious and parasitic diseases: Secondary | ICD-10-CM | POA: Diagnosis not present

## 2014-01-21 DIAGNOSIS — R319 Hematuria, unspecified: Secondary | ICD-10-CM | POA: Insufficient documentation

## 2014-01-21 DIAGNOSIS — Z79899 Other long term (current) drug therapy: Secondary | ICD-10-CM | POA: Insufficient documentation

## 2014-01-21 LAB — CBC WITH DIFFERENTIAL/PLATELET
BASOS ABS: 0 10*3/uL (ref 0.0–0.1)
BASOS PCT: 1 % (ref 0–1)
EOS ABS: 0.3 10*3/uL (ref 0.0–0.7)
Eosinophils Relative: 4 % (ref 0–5)
HCT: 38.4 % (ref 36.0–46.0)
Hemoglobin: 13.5 g/dL (ref 12.0–15.0)
LYMPHS ABS: 3.9 10*3/uL (ref 0.7–4.0)
Lymphocytes Relative: 52 % — ABNORMAL HIGH (ref 12–46)
MCH: 30.3 pg (ref 26.0–34.0)
MCHC: 35.2 g/dL (ref 30.0–36.0)
MCV: 86.3 fL (ref 78.0–100.0)
Monocytes Absolute: 0.4 10*3/uL (ref 0.1–1.0)
Monocytes Relative: 5 % (ref 3–12)
NEUTROS PCT: 38 % — AB (ref 43–77)
Neutro Abs: 2.9 10*3/uL (ref 1.7–7.7)
PLATELETS: 300 10*3/uL (ref 150–400)
RBC: 4.45 MIL/uL (ref 3.87–5.11)
RDW: 11.7 % (ref 11.5–15.5)
WBC: 7.5 10*3/uL (ref 4.0–10.5)

## 2014-01-21 LAB — URINE MICROSCOPIC-ADD ON

## 2014-01-21 LAB — URINALYSIS, ROUTINE W REFLEX MICROSCOPIC
Bilirubin Urine: NEGATIVE
GLUCOSE, UA: NEGATIVE mg/dL
Ketones, ur: 15 mg/dL — AB
Nitrite: NEGATIVE
Protein, ur: NEGATIVE mg/dL
SPECIFIC GRAVITY, URINE: 1.022 (ref 1.005–1.030)
Urobilinogen, UA: 0.2 mg/dL (ref 0.0–1.0)
pH: 5.5 (ref 5.0–8.0)

## 2014-01-21 LAB — COMPREHENSIVE METABOLIC PANEL
ALBUMIN: 4 g/dL (ref 3.5–5.2)
ALT: 16 U/L (ref 0–35)
AST: 14 U/L (ref 0–37)
Alkaline Phosphatase: 62 U/L (ref 39–117)
Anion gap: 12 (ref 5–15)
BUN: 10 mg/dL (ref 6–23)
CO2: 25 mEq/L (ref 19–32)
Calcium: 9.6 mg/dL (ref 8.4–10.5)
Chloride: 104 mEq/L (ref 96–112)
Creatinine, Ser: 0.93 mg/dL (ref 0.50–1.10)
GFR calc Af Amer: >90 mL/min (ref >90)
GFR calc non Af Amer: 87 mL/min — ABNORMAL LOW (ref >90)
GLUCOSE: 85 mg/dL (ref 70–99)
Potassium: 3.7 mEq/L (ref 3.7–5.3)
SODIUM: 141 meq/L (ref 137–147)
TOTAL PROTEIN: 7.5 g/dL (ref 6.0–8.3)
Total Bilirubin: 0.2 mg/dL — ABNORMAL LOW (ref 0.3–1.2)

## 2014-01-21 LAB — LIPASE, BLOOD: Lipase: 34 U/L (ref 11–59)

## 2014-01-21 LAB — POC URINE PREG, ED: PREG TEST UR: NEGATIVE

## 2014-01-21 MED ORDER — HYDROCODONE-ACETAMINOPHEN 5-325 MG PO TABS: 2.0000 | ORAL_TABLET | ORAL | Status: DC | PRN

## 2014-01-21 MED ORDER — IBUPROFEN 800 MG PO TABS
800.0000 mg | ORAL_TABLET | Freq: Once | ORAL | Status: AC
  Administered 2014-01-21: 800 mg via ORAL
  Filled 2014-01-21: qty 1

## 2014-01-21 MED ORDER — HYDROCODONE-ACETAMINOPHEN 5-325 MG PO TABS
1.0000 | ORAL_TABLET | Freq: Once | ORAL | Status: AC
  Administered 2014-01-21: 1 via ORAL
  Filled 2014-01-21: qty 1

## 2014-01-21 MED ORDER — IBUPROFEN 600 MG PO TABS: 600.0000 mg | ORAL_TABLET | Freq: Four times a day (QID) | ORAL | Status: DC | PRN

## 2014-01-21 NOTE — ED Notes (Signed)
Pt c/o left flank pain since 0030. Pt states she was awoken from sleep with the pain. Pain radiates to left lower abdomen. Pt denies dysuria. Pt states she took a Vicodin at home without relief

## 2014-01-21 NOTE — ED Notes (Signed)
MD at bedside. 

## 2014-01-21 NOTE — ED Provider Notes (Signed)
CSN: [id number]     Arrival date & time 01/21/14  0047 History   First MD Initiated Contact with Patient 01/21/14 0249     Chief Complaint  Patient presents with  . Flank Pain     (Consider location/radiation/quality/duration/timing/severity/associated sxs/prior Treatment) HPI Comments: 23 year old healthy female with STD history, smoker presents with acute left flank/back pain that woke her from sleep. No history of similar. No kidney stone history. No vaginal or lower, pain symptoms. Patient not currently pregnant. Pain is fairly constant nothing improves it. Started this evening around midnight. No abdominal surgery history  Patient is a 23 y.o. female presenting with flank pain. The history is provided by the patient.  Flank Pain This is a new problem. Pertinent negatives include no chest pain, no abdominal pain, no headaches and no shortness of breath.    Past Medical History  Diagnosis Date  . HSV-1 (herpes simplex virus 1) infection   . Chlamydia contact, treated   . Trichimoniasis 11/09   Past Surgical History  Procedure Laterality Date  . Implanon      removed 12/07/11   Family History  Problem Relation Age of Onset  . Diabetes Maternal Grandmother    History  Substance Use Topics  . Smoking status: Current Every Day Smoker -- 0.50 packs/day    Types: Cigarettes  . Smokeless tobacco: Not on file  . Alcohol Use: No   OB History   Grav Para Term Preterm Abortions TAB SAB Ect Mult Living   0 0             Review of Systems  Constitutional: Positive for appetite change. Negative for fever and chills.  HENT: Negative for congestion.   Eyes: Negative for visual disturbance.  Respiratory: Negative for shortness of breath.   Cardiovascular: Negative for chest pain.  Gastrointestinal: Negative for vomiting and abdominal pain.  Genitourinary: Positive for flank pain. Negative for dysuria.  Musculoskeletal: Negative for back pain, neck pain and neck stiffness.  Skin:  Negative for rash.  Neurological: Negative for light-headedness and headaches.      Allergies  Review of patient's allergies indicates no known allergies.  Home Medications   Prior to Admission medications   Medication Sig Start Date End Date Taking? Authorizing Provider  amphetamine-dextroamphetamine (ADDERALL XR) 20 MG 24 hr capsule Take 20 mg by mouth daily.   Yes Historical Provider, MD  HYDROcodone-acetaminophen (NORCO/VICODIN) 5-325 MG per tablet Take 1-2 tablets by mouth every 4 (four) hours as needed. 01/01/14  Yes Shari A Upstill, PA-C  HYDROcodone-acetaminophen (NORCO) 5-325 MG per tablet Take 2 tablets by mouth every 4 (four) hours as needed. 01/21/14   Enid Skeens, MD  ibuprofen (ADVIL,MOTRIN) 600 MG tablet Take 1 tablet (600 mg total) by mouth every 6 (six) hours as needed. 01/21/14   Enid Skeens, MD   BP 100/57  Pulse 90  Temp(Src) 98.1 F (36.7 C) (Oral)  Resp 16  Ht 5\' 7"  (1.702 m)  Wt 218 lb (98.884 kg)  BMI 34.14 kg/m2  SpO2 98%  LMP 12/17/2013 Physical Exam  Nursing note and vitals reviewed. Constitutional: She is oriented to person, place, and time. She appears well-developed and well-nourished.  HENT:  Head: Normocephalic and atraumatic.  Eyes: Conjunctivae are normal. Right eye exhibits no discharge. Left eye exhibits no discharge.  Neck: Normal range of motion. Neck supple. No tracheal deviation present.  Cardiovascular: Normal rate and regular rhythm.   Pulmonary/Chest: Effort normal and breath sounds normal.  Abdominal: Soft.  She exhibits no distension. There is tenderness (mild left flank pain near kidney region.). There is no guarding.  Musculoskeletal: She exhibits no edema.  Neurological: She is alert and oriented to person, place, and time.  Skin: Skin is warm. No rash noted.  Psychiatric: She has a normal mood and affect.    ED Course  Procedures (including critical care time) Emergency Focused Ultrasound Exam Limited retroperitoneal  ultrasound of kidneys  Performed and interpreted by Dr. Jodi MourningZavitz Indication: flank pain Focused abdominal ultrasound with both kidneys imaged in transverse and longitudinal planes in real-time. Interpretation: No hydronephrosis visualized.   Images archived electronically  Labs Review Labs Reviewed  CBC WITH DIFFERENTIAL - Abnormal; Notable for the following:    Neutrophils Relative % 38 (*)    Lymphocytes Relative 52 (*)    All other components within normal limits  COMPREHENSIVE METABOLIC PANEL - Abnormal; Notable for the following:    Total Bilirubin 0.2 (*)    GFR calc non Af Amer 87 (*)    All other components within normal limits  URINALYSIS, ROUTINE W REFLEX MICROSCOPIC - Abnormal; Notable for the following:    APPearance CLOUDY (*)    Hgb urine dipstick LARGE (*)    Ketones, ur 15 (*)    Leukocytes, UA SMALL (*)    All other components within normal limits  LIPASE, BLOOD  URINE MICROSCOPIC-ADD ON  POC URINE PREG, ED    Imaging Review No results found.   EKG Interpretation None      MDM   Final diagnoses:  Acute left flank pain  Hematuria   Well-appearing female with pain controlled in ER presents with left flank pain. Discussed differential musculoskeletal, kidney stone, other. Bedside ultrasound no hydronephrosis. Mild hematuria. Discussed possibly kidney stone. Patient called with holding on CT scan as it unlikely change management and plan for pain control straining urine and followup outpatient.  Results and differential diagnosis were discussed with the patient/parent/guardian. Close follow up outpatient was discussed, comfortable with the plan.   Medications  ibuprofen (ADVIL,MOTRIN) tablet 800 mg (800 mg Oral Given 01/21/14 0423)  HYDROcodone-acetaminophen (NORCO/VICODIN) 5-325 MG per tablet 1 tablet (1 tablet Oral Given 01/21/14 0423)    Filed Vitals:   01/21/14 0051 01/21/14 0300 01/21/14 0331 01/21/14 0424  BP: 128/80 107/70 104/63 100/57   Pulse: 73 71 75 90  Temp: 98 F (36.7 C)   98.1 F (36.7 C)  TempSrc: Oral   Oral  Resp: 22  16 16   Height: 5\' 7"  (1.702 m)     Weight: 218 lb (98.884 kg)     SpO2: 97% 96% 97% 98%    Final diagnoses:  Acute left flank pain  Hematuria      Enid SkeensJoshua M Demetres Prochnow, MD 01/21/14 [phone]

## 2014-01-21 NOTE — Discharge Instructions (Signed)
If you were given medicines take as directed.  If you are on coumadin or contraceptives realize their levels and effectiveness is altered by many different medicines.  If you have any reaction (rash, tongues swelling, other) to the medicines stop taking and see a physician.   Strain urine. Please follow up as directed and return to the ER or see a physician for new or worsening symptoms.  Thank you. Filed Vitals:   01/21/14 0051 01/21/14 0300 01/21/14 0331 01/21/14 0424  BP: 128/80 107/70 104/63 100/57  Pulse: 73 71 75 90  Temp: 98 F (36.7 C)   98.1 F (36.7 C)  TempSrc: Oral   Oral  Resp: 22  16 16   Height: 5\' 7"  (1.702 m)     Weight: 218 lb (98.884 kg)     SpO2: 97% 96% 97% 98%

## 2014-03-03 ENCOUNTER — Ambulatory Visit: Payer: 59 | Admitting: Certified Nurse Midwife

## 2014-03-03 ENCOUNTER — Encounter: Payer: Self-pay | Admitting: Certified Nurse Midwife

## 2014-03-03 ENCOUNTER — Telehealth: Payer: Self-pay | Admitting: Certified Nurse Midwife

## 2014-03-03 NOTE — Telephone Encounter (Signed)
Patient calling to reschedule her missed appointment with Ortencia Kick. Leonard, CNM for [facility] today. She says she was "up sick all night nauseous and sweaty." Patient rescheduled to 04/22/14. No missed appointment fee applied.

## 2014-03-25 ENCOUNTER — Ambulatory Visit: Payer: 59 | Admitting: Certified Nurse Midwife

## 2014-03-25 NOTE — Telephone Encounter (Signed)
Pt cancelled appointment for today because she didn't have her outstanding balance that was due. Will call back to reschedule.

## 2014-04-22 ENCOUNTER — Ambulatory Visit: Payer: 59 | Admitting: Certified Nurse Midwife

## 2014-05-21 ENCOUNTER — Telehealth: Payer: Self-pay | Admitting: Certified Nurse Midwife

## 2014-05-21 NOTE — Telephone Encounter (Signed)
Erroneous encounter

## 2014-05-22 ENCOUNTER — Encounter: Payer: Self-pay | Admitting: Certified Nurse Midwife

## 2014-05-22 ENCOUNTER — Ambulatory Visit (INDEPENDENT_AMBULATORY_CARE_PROVIDER_SITE_OTHER): Payer: 59 | Admitting: Certified Nurse Midwife

## 2014-05-22 VITALS — BP 110/68 | HR 70 | Resp 16 | Ht 66.25 in | Wt 211.0 lb

## 2014-05-22 DIAGNOSIS — Z124 Encounter for screening for malignant neoplasm of cervix: Secondary | ICD-10-CM

## 2014-05-22 DIAGNOSIS — N760 Acute vaginitis: Secondary | ICD-10-CM

## 2014-05-22 DIAGNOSIS — IMO0002 Reserved for concepts with insufficient information to code with codable children: Secondary | ICD-10-CM

## 2014-05-22 DIAGNOSIS — N898 Other specified noninflammatory disorders of vagina: Secondary | ICD-10-CM

## 2014-05-22 DIAGNOSIS — B9689 Other specified bacterial agents as the cause of diseases classified elsewhere: Secondary | ICD-10-CM

## 2014-05-22 DIAGNOSIS — Z0000 Encounter for general adult medical examination without abnormal findings: Secondary | ICD-10-CM

## 2014-05-22 DIAGNOSIS — Z01419 Encounter for gynecological examination (general) (routine) without abnormal findings: Secondary | ICD-10-CM

## 2014-05-22 DIAGNOSIS — A499 Bacterial infection, unspecified: Secondary | ICD-10-CM

## 2014-05-22 LAB — POCT URINALYSIS DIPSTICK
Bilirubin, UA: NEGATIVE
GLUCOSE UA: NEGATIVE
Ketones, UA: NEGATIVE
Leukocytes, UA: NEGATIVE
Nitrite, UA: NEGATIVE
Protein, UA: NEGATIVE
RBC UA: NEGATIVE
UROBILINOGEN UA: NEGATIVE
pH, UA: 5

## 2014-05-22 LAB — POCT URINE PREGNANCY: Preg Test, Ur: NEGATIVE

## 2014-05-22 NOTE — Patient Instructions (Addendum)
Prenatal Care  WHAT IS PRENATAL CARE?  Prenatal care means health care during your pregnancy, before your baby is born. It is very important to take care of yourself and your baby during your pregnancy by:   Getting early prenatal care. If you know you are pregnant, or think you might be pregnant, call your health care provider as soon as possible. Schedule a visit for a prenatal exam.  Getting regular prenatal care. Follow your health care provider's schedule for blood and other necessary tests. Do not miss appointments.  Doing everything you can to keep yourself and your baby healthy during your pregnancy.  Getting complete care. Prenatal care should include evaluation of the medical, dietary, educational, psychological, and social needs of you and your significant other. The medical and genetic history of your family and the family of your baby's father should be discussed with your health care provider.  Discussing with your health care provider:  Prescription, over-the-counter, and herbal medicines that you take.  Any history of substance abuse, alcohol use, smoking, and illegal drug use.  Any history of domestic abuse and violence.  Immunizations you have received.  Your nutrition and diet.  The amount of exercise you do.  Any environmental and occupational hazards to which you are exposed.  History of sexually transmitted infections for both you and your partner.  Previous pregnancies you have had. WHY IS PRENATAL CARE SO IMPORTANT?  By regularly seeing your health care provider, you help ensure that problems can be identified early so that they can be treated as soon as possible. Other problems might be prevented. Many studies have shown that early and regular prenatal care is important for the health of mothers and their babies.  HOW CAN I TAKE CARE OF MYSELF WHILE I AM PREGNANT?  Here are ways to take care of yourself and your baby:   Start or continue taking your  multivitamin with 400 micrograms (mcg) of folic acid every day.  Get early and regular prenatal care. It is very important to see a health care provider during your pregnancy. Your health care provider will check at each visit to make sure that you and your baby are healthy. If there are any problems, action can be taken right away to help you and your baby.  Eat a healthy diet that includes:  Fruits.  Vegetables.  Foods low in saturated fat.  Whole grains.  Calcium-rich foods, such as milk, yogurt, and hard cheeses.  Drink 6-8 glasses of liquids a day.  Unless your health care provider tells you not to, try to be physically active for 30 minutes, most days of the week. If you are pressed for time, you can get your activity in through 10-minute segments, three times a day.  Do not smoke, drink alcohol, or use drugs. These can cause long-term damage to your baby. Talk with your health care provider about steps to take to stop smoking. Talk with a member of your faith community, a counselor, a trusted friend, or your health care provider if you are concerned about your alcohol or drug use.  Ask your health care provider before taking any medicine, even over-the-counter medicines. Some medicines are not safe to take during pregnancy.  Get plenty of rest and sleep.  Avoid hot tubs and saunas during pregnancy.  Do not have X-rays taken unless absolutely necessary and with the recommendation of your health care provider. A lead shield can be placed on your abdomen to protect your baby when   X-rays are taken in other parts of your body.  Do not empty the cat litter when you are pregnant. It may contain a parasite that causes an infection called toxoplasmosis, which can cause birth defects. Also, use gloves when working in garden areas used by cats.  Do not eat uncooked or undercooked meats or fish.  Do not eat soft, mold-ripened cheeses (Brie, Camembert, and chevre) or soft, blue-veined  cheese (Danish blue and Roquefort).  Stay away from toxic chemicals like:  Insecticides.  Solvents (some cleaners or paint thinners).  Lead.  Mercury.  Sexual intercourse may continue until the end of the pregnancy, unless you have a medical problem or there is a problem with the pregnancy and your health care provider tells you not to.  Do not wear high-heel shoes, especially during the second half of the pregnancy. You can lose your balance and fall.  Do not take long trips, unless absolutely necessary. Be sure to see your health care provider before going on the trip.  Do not sit in one position for more than 2 hours when on a trip.  Take a copy of your medical records when going on a trip. Know where a hospital is located in the city you are visiting, in case of an emergency.  Most dangerous household products will have pregnancy warnings on their labels. Ask your health care provider about products if you are unsure.  Limit or eliminate your caffeine intake from coffee, tea, sodas, medicines, and chocolate.  Many women continue working through pregnancy. Staying active might help you stay healthier. If you have a question about the safety or the hours you work at your particular job, talk with your health care provider.  Get informed:  Read books.  Watch videos.  Go to childbirth classes for you and your significant other.  Talk with experienced moms.  Ask your health care provider about childbirth education classes for you and your partner. Classes can help you and your partner prepare for the birth of your baby.  Ask about a baby doctor (pediatrician) and methods and pain medicine for labor, delivery, and possible cesarean delivery. HOW OFTEN SHOULD I SEE MY HEALTH CARE PROVIDER DURING PREGNANCY?  Your health care provider will give you a schedule for your prenatal visits. You will have visits more often as you get closer to the end of your pregnancy. An average  pregnancy lasts about 40 weeks.  A typical schedule includes visiting your health care provider:   About once each month during your first 6 months of pregnancy.  Every 2 weeks during the next 2 months.  Weekly in the last month, until the delivery date. Your health care provider will probably want to see you more often if:  You are older than 35 years.  Your pregnancy is high risk because you have certain health problems or problems with the pregnancy, such as:  Diabetes.  High blood pressure.  The baby is not growing on schedule, according to the dates of the pregnancy. Your health care provider will do special tests to make sure you and your baby are not having any serious problems. WHAT HAPPENS DURING PRENATAL VISITS?   At your first prenatal visit, your health care provider will do a physical exam and talk to you about your health history and the health history of your partner and your family. Your health care provider will be able to tell you what date to expect your baby to be born on.  Your   first physical exam will include checks of your blood pressure, measurements of your height and weight, and an exam of your pelvic organs. Your health care provider will do a Pap test if you have not had one recently and will do cultures of your cervix to make sure there is no infection.  At each prenatal visit, there will be tests of your blood, urine, blood pressure, weight, and the progress of the baby will be checked.  At your later prenatal visits, your health care provider will check how you are doing and how your baby is developing. You may have a number of tests done as your pregnancy progresses.  Ultrasound exams are often used to check on your baby's growth and health.  You may have more urine and blood tests, as well as special tests, if needed. These may include amniocentesis to examine fluid in the pregnancy sac, stress tests to check how the baby responds to contractions, or a  biophysical profile to measure your baby's well-being. Your health care provider will explain the tests and why they are necessary.  You should be tested for high blood sugar (gestational diabetes) between the 24th and 28th weeks of your pregnancy.  You should discuss with your health care provider your plans to breastfeed or bottle-feed your baby.  Each visit is also a chance for you to learn about staying healthy during pregnancy and to ask questions. Document Released: 04/14/2003 Document Revised: 04/16/2013 Document Reviewed: 06/26/2013 [facility] Patient Information 2015 [place], [place]. This information is not intended to replace advice given to you by your health care provider. Make sure you discuss any questions you have with your health care provider.       General topics  Next pap or exam is  due in 1 year Take a Women's multivitamin Take 1200 mg. of calcium daily - prefer dietary If any concerns in interim to call back  Breast Self-Awareness Practicing breast self-awareness may pick up problems early, prevent significant medical complications, and possibly save your life. By practicing breast self-awareness, you can become familiar with how your breasts look and feel and if your breasts are changing. This allows you to notice changes early. It can also offer you some reassurance that your breast health is good. One way to learn what is normal for your breasts and whether your breasts are changing is to do a breast self-exam. If you find a lump or something that was not present in the past, it is best to contact your caregiver right away. Other findings that should be evaluated by your caregiver include nipple discharge, especially if it is bloody; skin changes or reddening; areas where the skin seems to be pulled in (retracted); or new lumps and bumps. Breast pain is seldom associated with cancer (malignancy), but should also be evaluated by a caregiver. BREAST SELF-EXAM The best time  to examine your breasts is 5 7 days after your menstrual period is over.  [employer] Patient Information 2013 [place].   Exercise to Stay Healthy Exercise helps you become and stay healthy. EXERCISE IDEAS AND TIPS Choose exercises that:  You enjoy.  Fit into your day. You do not need to exercise really hard to be healthy. You can do exercises at a slow or medium level and stay healthy. You can:  Stretch before and after working out.  Try yoga, Pilates, or tai chi.  Lift weights.  Walk fast, swim, jog, run, climb stairs, bicycle, dance, or rollerskate.  Take aerobic classes. Exercises  that burn about 150 calories:  Running 1  miles in 15 minutes.  Playing volleyball for 45 to 60 minutes.  Washing and waxing a car for 45 to 60 minutes.  Playing touch football for 45 minutes.  Walking 1  miles in 35 minutes.  Pushing a stroller 1  miles in 30 minutes.  Playing basketball for 30 minutes.  Raking leaves for 30 minutes.  Bicycling 5 miles in 30 minutes.  Walking 2 miles in 30 minutes.  Dancing for 30 minutes.  Shoveling [place] for 15 minutes.  Swimming laps for 20 minutes.  Walking up stairs for 15 minutes.  Bicycling 4 miles in 15 minutes.  Gardening for 30 to 45 minutes.  Jumping rope for 15 minutes.  Washing windows or floors for 45 to 60 minutes. Document Released: 05/14/2010 Document Revised: 07/04/2011 Document Reviewed: 05/14/2010 [facility] Patient Information 2013 [place].   Other topics ( that may be useful information):    Sexually Transmitted Disease Sexually transmitted disease (STD) refers to any infection that is passed from person to person during sexual activity. This may happen by way of saliva, semen, blood, vaginal mucus, or urine. Common STDs include:  Gonorrhea.  Chlamydia.  Syphilis.  HIV/AIDS.  Genital herpes.  Hepatitis B and C.  Trichomonas.  Human papillomavirus (HPV).  Pubic lice. CAUSES  An STD  may be spread by bacteria, virus, or parasite. A person can get an STD by:  Sexual intercourse with an infected person.  Sharing sex toys with an infected person.  Sharing needles with an infected person.  Having intimate contact with the genitals, mouth, or rectal areas of an infected person. SYMPTOMS  Some people may not have any symptoms, but they can still pass the infection to others. Different STDs have different symptoms. Symptoms include:  Painful or bloody urination.  Pain in the pelvis, abdomen, vagina, anus, throat, or eyes.  Skin rash, itching, irritation, growths, or sores (lesions). These usually occur in the genital or anal area.  Abnormal vaginal discharge.  Penile discharge in men.  Soft, flesh-colored skin growths in the genital or anal area.  Fever.  Pain or bleeding during sexual intercourse.  Swollen glands in the groin area.  Yellow skin and eyes (jaundice). This is seen with hepatitis. DIAGNOSIS  To make a diagnosis, your caregiver may:  Take a medical history.  Perform a physical exam.  Take a specimen (culture) to be examined.  Examine a sample of discharge under a microscope.  Perform blood test TREATMENT   Chlamydia, gonorrhea, trichomonas, and syphilis can be cured with antibiotic medicine.  Genital herpes, hepatitis, and HIV can be treated, but not cured, with prescribed medicines. The medicines will lessen the symptoms.  Genital warts from HPV can be treated with medicine or by freezing, burning (electrocautery), or surgery. Warts may come back.  HPV is a virus and cannot be cured with medicine or surgery.However, abnormal areas may be followed very closely by your caregiver and may be removed from the cervix, vagina, or vulva through office procedures or surgery. If your diagnosis is confirmed, your recent sexual partners need treatment. This is true even if they are symptom-free or have a negative culture or evaluation. They should  not have sex until their caregiver says it is okay. HOME CARE INSTRUCTIONS  All sexual partners should be informed, tested, and treated for all STDs.  Take your antibiotics as directed. Finish them even if you start to feel better.  Only take over-the-counter or prescription  medicines for pain, discomfort, or fever as directed by your caregiver.  Rest.  Eat a balanced diet and drink enough fluids to keep your urine clear or pale yellow.  Do not have sex until treatment is completed and you have followed up with your caregiver. STDs should be checked after treatment.  Keep all follow-up appointments, Pap tests, and blood tests as directed by your caregiver.  Only use latex condoms and water-soluble lubricants during sexual activity. Do not use petroleum jelly or oils.  Avoid alcohol and illegal drugs.  Get vaccinated for HPV and hepatitis. If you have not received these vaccines in the past, talk to your caregiver about whether one or both might be right for you.  Avoid risky sex practices that can break the skin. The only way to avoid getting an STD is to avoid all sexual activity.Latex condoms and dental dams (for oral sex) will help lessen the risk of getting an STD, but will not completely eliminate the risk. SEEK MEDICAL CARE IF:   You have a fever.  You have any new or worsening symptoms. Document Released: 07/02/2002 Document Revised: 07/04/2011 Document Reviewed: 07/09/2010 [facility] Patient Information 2013 [place].    Domestic Abuse You are being battered or abused if someone close to you hits, pushes, or physically hurts you in any way. You also are being abused if you are forced into activities. You are being sexually abused if you are forced to have sexual contact of any kind. You are being emotionally abused if you are made to feel worthless or if you are constantly threatened. It is important to remember that help is available. No one has the right to abuse  you. PREVENTION OF FURTHER ABUSE  Learn the warning signs of danger. This varies with situations but may include: the use of alcohol, threats, isolation from friends and family, or forced sexual contact. Leave if you feel that violence is going to occur.  If you are attacked or beaten, report it to the police so the abuse is documented. You do not have to press charges. The police can protect you while you or the attackers are leaving. Get the officer's name and badge number and a copy of the report.  Find someone you can trust and tell them what is happening to you: your caregiver, a nurse, clergy member, close friend or family member. Feeling ashamed is natural, but remember that you have done nothing wrong. No one deserves abuse. Document Released: 04/08/2000 Document Revised: 07/04/2011 Document Reviewed: 06/17/2010 [facility] Patient Information 2013 [place].    How Much is Too Much Alcohol? Drinking too much alcohol can cause injury, accidents, and health problems. These types of problems can include:   Car crashes.  Falls.  Family fighting (domestic violence).  Drowning.  Fights.  Injuries.  Burns.  Damage to certain organs.  Having a baby with birth defects. ONE DRINK CAN BE TOO MUCH WHEN YOU ARE:  Working.  Pregnant or breastfeeding.  Taking medicines. Ask your doctor.  Driving or planning to drive. If you or someone you know has a drinking problem, get help from a doctor.  Document Released: 02/05/2009 Document Revised: 07/04/2011 Document Reviewed: 02/05/2009 [facility] Patient Information 2013 [place].   Smoking Hazards Smoking cigarettes is extremely bad for your health. Tobacco smoke has over 200 known poisons in it. There are over 60 chemicals in tobacco smoke that cause cancer. Some of the chemicals found in cigarette smoke include:   Cyanide.  Benzene.  Formaldehyde.  Methanol (wood alcohol).  Acetylene (fuel used in welding  torches).  Ammonia. Cigarette smoke also contains the poisonous gases nitrogen oxide and carbon monoxide.  Cigarette smokers have an increased risk of many serious medical problems and Smoking causes approximately:  90% of all lung cancer deaths in men.  80% of all lung cancer deaths in women.  90% of deaths from chronic obstructive lung disease. Compared with nonsmokers, smoking increases the risk of:  Coronary heart disease by 2 to 4 times.  Stroke by 2 to 4 times.  Men developing lung cancer by 23 times.  Women developing lung cancer by 13 times.  Dying from chronic obstructive lung diseases by 12 times.  . Smoking is the most preventable cause of death and disease in our society.  WHY IS SMOKING ADDICTIVE?  Nicotine is the chemical agent in tobacco that is capable of causing addiction or dependence.  When you smoke and inhale, nicotine is absorbed rapidly into the bloodstream through your lungs. Nicotine absorbed through the lungs is capable of creating a powerful addiction. Both inhaled and non-inhaled nicotine may be addictive.  Addiction studies of cigarettes and spit tobacco show that addiction to nicotine occurs mainly during the teen years, when young people begin using tobacco products. WHAT ARE THE BENEFITS OF QUITTING?  There are many health benefits to quitting smoking.   Likelihood of developing cancer and heart disease decreases. Health improvements are seen almost immediately.  Blood pressure, pulse rate, and breathing patterns start returning to normal soon after quitting. QUITTING SMOKING   American Lung Association - 1-800-LUNGUSA  American Cancer Society - 1-800-ACS-2345 Document Released: 05/19/2004 Document Revised: 07/04/2011 Document Reviewed: 01/21/2009 [facility] Patient Information 2013 [place].   Stress Management Stress is a state of physical or mental tension that often results from changes in your life or normal routine. Some common  causes of stress are:  Death of a loved one.  Injuries or severe illnesses.  Getting fired or changing jobs.  Moving into a new home. Other causes may be:  Sexual problems.  Business or financial losses.  Taking on a large debt.  Regular conflict with someone at home or at work.  Constant tiredness from lack of sleep. It is not just bad things that are stressful. It may be stressful to:  Win the lottery.  Get married.  Buy a new car. The amount of stress that can be easily tolerated varies from person to person. Changes generally cause stress, regardless of the types of change. Too much stress can affect your health. It may lead to physical or emotional problems. Too little stress (boredom) may also become stressful. SUGGESTIONS TO REDUCE STRESS:  Talk things over with your family and friends. It often is helpful to share your concerns and worries. If you feel your problem is serious, you may want to get help from a professional counselor.  Consider your problems one at a time instead of lumping them all together. Trying to take care of everything at once may seem impossible. List all the things you need to do and then start with the most important one. Set a goal to accomplish 2 or 3 things each day. If you expect to do too many in a single day you will naturally fail, causing you to feel even more stressed.  Do not use alcohol or drugs to relieve stress. Although you may feel better for a short time, they do not remove the problems that caused the stress. They  can also be habit forming.  Exercise regularly - at least 3 times per week. Physical exercise can help to relieve that "uptight" feeling and will relax you.  The shortest distance between despair and hope is often a good night's sleep.  Go to bed and get up on time allowing yourself time for appointments without being rushed.  Take a short "time-out" period from any stressful situation that occurs during the day.  Close your eyes and take some deep breaths. Starting with the muscles in your face, tense them, hold it for a few seconds, then relax. Repeat this with the muscles in your neck, shoulders, hand, stomach, back and legs.  Take good care of yourself. Eat a balanced diet and get plenty of rest.  Schedule time for having fun. Take a break from your daily routine to relax. HOME CARE INSTRUCTIONS   Call if you feel overwhelmed by your problems and feel you can no longer manage them on your own.  Return immediately if you feel like hurting yourself or someone else. Document Released: 10/05/2000 Document Revised: 07/04/2011 Document Reviewed: 05/28/2007 [facility] Patient Information 2013 [place].

## 2014-05-22 NOTE — Progress Notes (Signed)
Reviewed personally.  M. Suzanne Dorrien Grunder, MD.  

## 2014-05-22 NOTE — Progress Notes (Signed)
24 y.o. G0P0 Single  Caucasian Fe here for annual exam.  Periods normal, no issues. Patient trying for pregnancy. Not on prenatal vitamins. Desires STD screening. Has almost stopped smoking and no alcohol use. Working full time now! No other health issues today.  Patient's last menstrual period was 05/01/2014.          Sexually active: Yes.    The current method of family planning is none.    Exercising: No.  exercise Smoker:  no  Health Maintenance: Pap:  06-21-12 neg MMG:  none Colonoscopy:  none BMD:   none TDaP:  2008 Labs: Poct urine-neg, Upt-neg Self breast exam: not done   reports that she has quit smoking. Her smoking use included Cigarettes. She smoked 0.50 packs per day. She does not have any smokeless tobacco history on file. She reports that she does not drink alcohol or use illicit drugs.  Past Medical History  Diagnosis Date  . HSV-1 (herpes simplex virus 1) infection   . Chlamydia contact, treated   . Trichimoniasis 11/09  . Gallstones     Past Surgical History  Procedure Laterality Date  . Implanon      removed 12/07/11    Current Outpatient Prescriptions  Medication Sig Dispense Refill  . amphetamine-dextroamphetamine (ADDERALL XR) 20 MG 24 hr capsule Take 20 mg by mouth daily.    . valACYclovir (VALTREX) 1000 MG tablet as needed.      No current facility-administered medications for this visit.    Family History  Problem Relation Age of Onset  . Diabetes Maternal Grandmother     ROS:  Pertinent items are noted in HPI.  Otherwise, a comprehensive ROS was negative.  Exam:   BP 110/68 mmHg  Pulse 70  Resp 16  Ht 5' 6.25" (1.683 m)  Wt 211 lb (95.709 kg)  BMI 33.79 kg/m2  LMP 05/01/2014 Height: 5' 6.25" (168.3 cm) Ht Readings from Last 3 Encounters:  05/22/14 5' 6.25" (1.683 m)  01/21/14  (1.702 m)  01/04/14  (1.702 m)    General appearance: alert, cooperative and appears stated age Head: Normocephalic, without obvious abnormality,  atraumatic Neck: no adenopathy, supple, symmetrical, trachea midline and thyroid normal to inspection and palpation Lungs: clear to auscultation bilaterally Breasts: normal appearance, no masses or tenderness, No nipple retraction or dimpling, No nipple discharge or bleeding, No axillary or supraclavicular adenopathy Heart: regular rate and rhythm Abdomen: soft, non-tender; no masses,  no organomegaly Extremities: extremities normal, atraumatic, no cyanosis or edema Skin: Skin color, texture, turgor normal. No rashes or lesions Lymph nodes: Cervical, supraclavicular, and axillary nodes normal. No abnormal inguinal nodes palpated Neurologic: Grossly normal   Pelvic: External genitalia:  no lesions              Urethra:  normal appearing urethra with no masses, tenderness or lesions              Bartholin's and Skene's: normal                 Vagina: normal appearing vagina with normal color and grey thick  discharge, no lesions patient had used Metrogel last pm. Affirm taken              Cervix: normal,non tender, no lesions              Pap taken: Yes.   Bimanual Exam:  Uterus:  normal size, contour, position, consistency, mobility, non-tender  Adnexa: normal adnexa and no mass, fullness, tenderness               Rectovaginal: Confirms               Anus:  normal sphincter tone, no lesions  Chaperone present: Yes  A:  Well Woman with normal exam  Contraception none, trying for pregnancy  Smoker, quitting now  STD screening  Rubella screen  R/O BV  P:   Reviewed health and wellness pertinent to exam  Given information on pregnancy and good health. Encouraged to stop smoking now and start on prenatal vitamins Daily. Discussed good nutritional choices.  Lab:HIV,RPR,Hep C, HSV 1,2,GC,Chlamydia, Rubella screen, Affirm  Pap smear taken today with HPV reflex    counseled on breast self exam, STD prevention, HIV risk factors and prevention, adequate intake of calcium and  vitamin D, diet and exercise. Discussed when she misses period will need to come for pregnancy confirmation.  return annually or prn  An After Visit Summary was printed and given to the patient.

## 2014-05-23 LAB — WET PREP BY MOLECULAR PROBE
Candida species: NEGATIVE
GARDNERELLA VAGINALIS: POSITIVE — AB
TRICHOMONAS VAG: NEGATIVE

## 2014-05-23 LAB — CBC
HCT: 38.2 % (ref 36.0–46.0)
Hemoglobin: 13.1 g/dL (ref 12.0–15.0)
MCH: 30.4 pg (ref 26.0–34.0)
MCHC: 34.3 g/dL (ref 30.0–36.0)
MCV: 88.6 fL (ref 78.0–100.0)
MPV: 9.9 fL (ref 8.6–12.4)
Platelets: 284 10*3/uL (ref 150–400)
RBC: 4.31 MIL/uL (ref 3.87–5.11)
RDW: 12.3 % (ref 11.5–15.5)
WBC: 9.4 10*3/uL (ref 4.0–10.5)

## 2014-05-23 LAB — HIV ANTIBODY (ROUTINE TESTING W REFLEX): HIV: NONREACTIVE

## 2014-05-23 LAB — RUBELLA SCREEN: Rubella: 1.85 Index — ABNORMAL HIGH (ref <0.90)

## 2014-05-23 LAB — HEPATITIS C ANTIBODY: HCV AB: NEGATIVE

## 2014-05-23 LAB — RPR

## 2014-05-26 ENCOUNTER — Telehealth: Payer: Self-pay | Admitting: Certified Nurse Midwife

## 2014-05-26 ENCOUNTER — Encounter: Payer: Self-pay | Admitting: Certified Nurse Midwife

## 2014-05-26 LAB — HSV(HERPES SIMPLEX VRS) I + II AB-IGG
HSV 1 GLYCOPROTEIN G AB, IGG: 8.07 IV — AB
HSV 2 Glycoprotein G Ab, IgG: <0.1 IV

## 2014-05-26 NOTE — Telephone Encounter (Signed)
Patient calling for recent results. °

## 2014-05-27 LAB — IPS N GONORRHOEA AND CHLAMYDIA BY PCR

## 2014-05-27 MED ORDER — METRONIDAZOLE 0.75 % VA GEL: 1.0000 | Freq: Two times a day (BID) | VAGINAL | Status: DC

## 2014-05-27 NOTE — Addendum Note (Signed)
Addended by: Verner CholLEONARD, Nickolaus Bordelon S on: 05/27/2014 05:56 PM   Modules accepted: Orders, SmartSet

## 2014-05-27 NOTE — Telephone Encounter (Signed)
Left message to call Zenas Santa at [phone].  Notes Recorded by Verner Choleborah S Leonard, CNM on 05/27/2014 at 7:24 AM Notify patient that affirm showed BV, but if no period yet unable to treat, she needs to call when period starts so we can treat with medication HIV, RPR, Hep C are negative Herpes 1 is positive, but Herpes 2 is negative Rubella screen positive for immunity CBC is normal Gc,Chlamydia and pap smear pending

## 2014-05-27 NOTE — Telephone Encounter (Signed)
Will not Rx until starts period, has been trying for pregnancy.

## 2014-05-27 NOTE — Telephone Encounter (Signed)
Spoke with patient. Patient states "I just started. I went to the bathroom and have started bleeding." Advised would let Verner Choleborah S. Leonard CNM know for rx to be sent in. Patient is agreeable.

## 2014-05-27 NOTE — Telephone Encounter (Signed)
Spoke with patient. Advised patient of message and results as seen below. Patient is agreeable. Patient states that she has started cramping today and is due to start her period tonight. "I usually start during the middle of the night. I have been having really bad cramping so I know I will start tonight or tomorrow." Requesting rx be sent in so that she can pick up when she starts her cycle. Advised will need to send a message over to Verner Choleborah S. Leonard CNM with request. Pharmacy on file is correct.

## 2014-05-28 LAB — IPS PAP TEST WITH REFLEX TO HPV

## 2014-05-28 NOTE — Telephone Encounter (Signed)
Rx sent and patient notified by My chart

## 2014-05-29 ENCOUNTER — Telehealth: Payer: Self-pay

## 2014-05-29 NOTE — Telephone Encounter (Signed)
Gel please

## 2014-05-29 NOTE — Telephone Encounter (Signed)
Patient calling in regards to medication sent in for BV. "She wants me to use it while I am on my period and she called in a gel. I thought it was going to be a pill." Advised patient will send Verner Choleborah S. Leonard CNM a message and return call with further recommendations. Patient is agreeable.   Verner Choleborah S. Leonard CNM would you like patient to treat with metrogel after cycle or does patient need PO?

## 2014-05-29 NOTE — Telephone Encounter (Signed)
Spoke with patient. Advised patient of message as seen below from Deborah S. Leonard CNM. Patient is agreeable and verbalizes understanding.   Routing to provider for final review. Patient agreeable to disposition. Will close encounter   

## 2014-07-28 ENCOUNTER — Telehealth: Payer: Self-pay | Admitting: Certified Nurse Midwife

## 2014-07-28 NOTE — Telephone Encounter (Signed)
Agree 

## 2014-07-28 NOTE — Telephone Encounter (Signed)
Spoke with patient. Patient states that she was seen at urgent care over the weekend and was prescribed Diflucan 150mg  #2. Patient has taken one Diflucan at this time. "How long before this works? I had to leave work today because this is terrible." Advised patient can take up to one week after completion of both doses for symptoms to resolve. Advised may use OTC hydrocortisone ointment, aveeno soothing bath and monistat 3 if needed. Patient is agreeable and verbalizes understanding.  Routing to provider for final review. Patient agreeable to disposition. Will close encounter

## 2014-07-28 NOTE — Telephone Encounter (Signed)
Patient is "miserable" and asking to talk to a nurse. No further details given. Last seen 05/22/14. Please call [phone] 1st then [phone].

## 2014-12-11 ENCOUNTER — Ambulatory Visit (INDEPENDENT_AMBULATORY_CARE_PROVIDER_SITE_OTHER): Payer: 59 | Admitting: Certified Nurse Midwife

## 2014-12-11 ENCOUNTER — Encounter: Payer: Self-pay | Admitting: Certified Nurse Midwife

## 2014-12-11 ENCOUNTER — Telehealth: Payer: Self-pay | Admitting: Certified Nurse Midwife

## 2014-12-11 VITALS — BP 120/80 | HR 72 | Resp 20 | Ht 66.25 in | Wt 225.0 lb

## 2014-12-11 DIAGNOSIS — N926 Irregular menstruation, unspecified: Secondary | ICD-10-CM

## 2014-12-11 DIAGNOSIS — N940 Mittelschmerz: Secondary | ICD-10-CM | POA: Diagnosis not present

## 2014-12-11 LAB — POCT URINE PREGNANCY: Preg Test, Ur: NEGATIVE

## 2014-12-11 NOTE — Telephone Encounter (Signed)
Spoke with patient. Patient states that her LMP ended on 12/04/2014. Woke up this morning and is having light bleeding with using the restroom. Was seen at a local urgent care recently for STD testing which she states were negative. Is being treated for a bacterial infection with metrogel. States she is not currently on any form of birth control and "I am not trying to get pregnant, but I am not preventing and have not gotten pregnant in three years." Denies any urinary symptoms or abdominal discomfort. Has not taken UPT. Patient would like to be seen in office with Verner Chol CNM for further evaluation. Appointment scheduled for today 12/11/2014 at 12:45pm with Verner Chol CNM. Patient is agreeable to date and time.  Routing to provider for final review. Patient agreeable to disposition. Will close encounter.   Patient aware provider will review message and nurse will return call if any additional advice or change of disposition.

## 2014-12-11 NOTE — Telephone Encounter (Signed)
Attempted to reach patient at number provided [phone], there was no answer and recording states the voicemail box is currently full and not accepting voicemails at this time. Attempted to reach patient at other mobile number on file  [phone]. There was no answer and no voicemail box available to leave a message.

## 2014-12-11 NOTE — Progress Notes (Signed)
24 y.o.Single white female g0p0 here with complaint of vaginal bleeding very scant amount and then had small amount of bright red blood today. Patient was diagnosed with BV on 11/19/14 and did not complete medication( could not find them) recent restarted on Flagyl, 2 days ago.. Feels vaginal odor is still present. . Last sexual activity 12/04/14. Concerned about bleeding and has only used one pad today. No STD concerns, recently screened all negative.. Urinary symptoms none . Contraception is none. Sees PCP for medication management of Adderall and Valtrex.  UPT negative   O:Healthy female WDWN Affect: normal, orientation x 3  Exam:Skin: warm and dry Abdomen: non tender, no rebound, soft Lymph node Inguinal: no enlargement or tenderness Pelvic exam: External genital: normal female, no lesions BUS: negative Vagina: odorous discharge noted, no blood noted Cervix: normal, non tender, no CMT, clear stringy mucous noted, ? Ovulatory, no blood noted Uterus: normal, non tender Adnexa:normal, non tender, no masses or fullness noted   Wet Prep results:not taken   A:Normal pelvic exam Ovulatory pain BV under treatment   P:Discussed findings of ovulatory pain and etiology and normal pelvic exam finding. Reviewed her menses calendar on her phone and per dates should ovulate today. Discussed symptoms of this and not a concern and pain is relatively short. She says it is going away now. Questions addressed .  Warning signs of abdominal pain given and to advise if occurs. Discussed completing medication given for BV to resolve this also. Encouraged condom use if for prevention of STD and pregnancy.  RV prn

## 2014-12-11 NOTE — Patient Instructions (Signed)
Bacterial Vaginosis Bacterial vaginosis is a vaginal infection that occurs when the normal balance of bacteria in the vagina is disrupted. It results from an overgrowth of certain bacteria. This is the most common vaginal infection in women of childbearing age. Treatment is important to prevent complications, especially in pregnant women, as it can cause a premature delivery. CAUSES  Bacterial vaginosis is caused by an increase in harmful bacteria that are normally present in smaller amounts in the vagina. Several different kinds of bacteria can cause bacterial vaginosis. However, the reason that the condition develops is not fully understood. RISK FACTORS Certain activities or behaviors can put you at an increased risk of developing bacterial vaginosis, including:  Having a new sex partner or multiple sex partners.  Douching.  Using an intrauterine device (IUD) for contraception. Women do not get bacterial vaginosis from toilet seats, bedding, swimming pools, or contact with objects around them. SIGNS AND SYMPTOMS  Some women with bacterial vaginosis have no signs or symptoms. Common symptoms include:  Grey vaginal discharge.  A fishlike odor with discharge, especially after sexual intercourse.  Itching or burning of the vagina and vulva.  Burning or pain with urination. DIAGNOSIS  Your health care provider will take a medical history and examine the vagina for signs of bacterial vaginosis. A sample of vaginal fluid may be taken. Your health care provider will look at this sample under a microscope to check for bacteria and abnormal cells. A vaginal pH test may also be done.  TREATMENT  Bacterial vaginosis may be treated with antibiotic medicines. These may be given in the form of a pill or a vaginal cream. A second round of antibiotics may be prescribed if the condition comes back after treatment.  HOME CARE INSTRUCTIONS   Only take over-the-counter or prescription medicines as  directed by your health care provider.  If antibiotic medicine was prescribed, take it as directed. Make sure you finish it even if you start to feel better.  Do not have sex until treatment is completed.  Tell all sexual partners that you have a vaginal infection. They should see their health care provider and be treated if they have problems, such as a mild rash or itching.  Practice safe sex by using condoms and only having one sex partner. SEEK MEDICAL CARE IF:   Your symptoms are not improving after 3 days of treatment.  You have increased discharge or pain.  You have a fever. MAKE SURE YOU:   Understand these instructions.  Will watch your condition.  Will get help right away if you are not doing well or get worse. FOR MORE INFORMATION  Centers for Disease Control and Prevention, Division of STD Prevention: www.cdc.gov/std American Sexual Health Association (ASHA): www.ashastd.org  Document Released: 04/11/2005 Document Revised: 01/30/2013 Document Reviewed: 11/21/2012 Exit[employer] Patient Information 2015 [employer]. This information is not intended to replace advice given to you by your health care provider. Make sure you discuss any questions you have with your health care provider.  

## 2014-12-11 NOTE — Telephone Encounter (Signed)
Patient is having some abnormal bleeding. Last seen 05/22/14.

## 2014-12-12 NOTE — Progress Notes (Signed)
Reviewed personally.  M. Suzanne Lindell Tussey, MD.  

## 2014-12-18 ENCOUNTER — Telehealth: Payer: Self-pay | Admitting: Certified Nurse Midwife

## 2014-12-18 NOTE — Telephone Encounter (Signed)
Attempted to reach patient at number provided [phone]. There was no answer and recording states that the voicemail box is currently full and is not accepting new messages at this time. Will try again later.

## 2014-12-18 NOTE — Telephone Encounter (Signed)
Patient was last seen 12/11/14 and is asking if she was tested for trichomonas. Patient says you may leave details on her voice mail.

## 2014-12-25 NOTE — Telephone Encounter (Signed)
Spoke with patient. Advised she was not tested for Trichomonas when she was here on 12/11/2014. Patient states that she has been seen at a local urgent care for additional STD testing. Advised patient to call if she needs anything. Patient is agreeable.  Routing to provider for final review. Patient agreeable to disposition. Will close encounter.

## 2015-02-09 ENCOUNTER — Encounter (HOSPITAL_COMMUNITY): Payer: Self-pay | Admitting: Emergency Medicine

## 2015-02-09 ENCOUNTER — Emergency Department (INDEPENDENT_AMBULATORY_CARE_PROVIDER_SITE_OTHER)
Admission: EM | Admit: 2015-02-09 | Discharge: 2015-02-09 | Disposition: A | Discharge status: Home / Self Care | Payer: 59 | Source: Home / Self Care | Attending: Family Medicine | Admitting: Family Medicine

## 2015-02-09 DIAGNOSIS — L0100 Impetigo, unspecified: Secondary | ICD-10-CM

## 2015-02-09 DIAGNOSIS — G44201 Tension-type headache, unspecified, intractable: Secondary | ICD-10-CM | POA: Diagnosis not present

## 2015-02-09 MED ORDER — TOPIRAMATE 50 MG PO TABS
50.0000 mg | ORAL_TABLET | Freq: Every day | ORAL | Status: DC
Start: 2015-02-09 — End: 2016-11-29

## 2015-02-09 MED ORDER — MUPIROCIN 2 % EX OINT: 1.0000 "application " | TOPICAL_OINTMENT | Freq: Three times a day (TID) | CUTANEOUS | Status: DC

## 2015-02-09 NOTE — ED Notes (Signed)
Pt here with c/o constant, migraine headache located at the top of scalp.  Pain started 8 days ago Pain with ambulation and activities Constant throb noted, denies n/v or sensitivity to light

## 2015-02-09 NOTE — ED Provider Notes (Signed)
CSN: [id number]     Arrival date & time 02/09/15  1649 History   First MD Initiated Contact with Patient 02/09/15 1715     No chief complaint on file.  (Consider location/radiation/quality/duration/timing/severity/associated sxs/prior Treatment) The history is provided by the patient.   cyst 24 year old woman who has a eight-day history of headache located on top of her head which is described as steady, not pounding. She thought she might be developing a sinus infection when it first began, but she's had no runny nose or congestion.  Patient also complains about a 2-3 mm blister on her left lower lip. She's been scraping this with the head of opinion but it keeps coming back. She's had HSV-1 before, but in a different location.   Patient had a complete physical which was normal about a week ago with Dr. Elias Elseobert Reade  Past Medical History  Diagnosis Date  . HSV-1 (herpes simplex virus 1) infection   . Chlamydia contact, treated   . Trichimoniasis 11/09  . Gallstones    Past Surgical History  Procedure Laterality Date  . Implanon      removed 12/07/11   Family History  Problem Relation Age of Onset  . Diabetes Maternal Grandmother    Social History  Substance Use Topics  . Smoking status: Current Some Day Smoker -- 0.50 packs/day    Types: Cigarettes  . Smokeless tobacco: Not on file  . Alcohol Use: No   OB History    Gravida Para Term Preterm AB TAB SAB Ectopic Multiple Living   0 0             Review of Systems  Constitutional: Negative.   Eyes: Negative.   Respiratory: Negative.   Cardiovascular: Negative.   Gastrointestinal: Negative.   Genitourinary: Negative.   Musculoskeletal: Negative.   Skin: Positive for pallor.  Neurological: Positive for headaches. Negative for dizziness, tremors, seizures, syncope, facial asymmetry, speech difficulty, weakness, light-headedness and numbness.  Psychiatric/Behavioral: Negative.     Allergies  Review of patient's  allergies indicates no known allergies.  Home Medications   Prior to Admission medications   Medication Sig Start Date End Date Taking? Authorizing Provider  amphetamine-dextroamphetamine (ADDERALL XR) 20 MG 24 hr capsule Take 20 mg by mouth daily.    Historical Provider, MD  metroNIDAZOLE (FLAGYL) 500 MG tablet Take 500 mg by mouth 2 (two) times daily.    Historical Provider, MD  valACYclovir (VALTREX) 1000 MG tablet as needed.  02/26/14   Historical Provider, MD   Meds Ordered and Administered this Visit  Medications - No data to display  There were no vitals taken for this visit. No data found.   Physical Exam  Constitutional: She is oriented to person, place, and time. She appears well-developed and well-nourished.  HENT:  Head: Normocephalic and atraumatic.  Right Ear: External ear normal.  Left Ear: External ear normal.  Nose: Nose normal.  Mouth/Throat: Oropharynx is clear and moist.  Eyes: Conjunctivae are normal.  Fundi normal  Neck: Normal range of motion. Neck supple. No thyromegaly present.  Cardiovascular: Normal rate.   Pulmonary/Chest: Effort normal.  Musculoskeletal: Normal range of motion.  Lymphadenopathy:    She has no cervical adenopathy.  Neurological: She is alert and oriented to person, place, and time. She has normal reflexes. No cranial nerve deficit. Coordination abnormal.  Skin:  Small crusty 3 mm scab on the vermilion border of left lower lip  Psychiatric: She has a normal mood and affect. Her behavior is  normal. Judgment and thought content normal.  Nursing note and vitals reviewed.   ED Course  Procedures (including critical care time) MDM  Tension type headache that has become rather intractable. She has no signs of serious disease at this point.  Plan: Bactroban ointment for the skin lesion on her lip, Topamax 50 mg daily at bedtime for the headache.  Signed, Elvina Sidle M.D.    Elvina Sidle, MD 02/09/15 1739

## 2015-02-09 NOTE — Discharge Instructions (Signed)
Impetigo, Adult Impetigo is an infection of the skin. It commonly occurs in young children, but it can also occur in adults. The infection causes itchy blisters and sores that produce brownish-yellow fluid. As the fluid dries, it forms a thick, honey-colored crust. These skin changes usually occur on the face but can also affect other areas of the body. Impetigo usually goes away in 7-10 days with treatment. CAUSES Impetigo is caused by two types of bacteria. It may be caused by staphylococci or streptococci bacteria. These bacteria cause impetigo when they get under the surface of the skin. This often happens after some damage to the skin, such as damage from:  Cuts, scrapes, or scratches.  Insect bites, especially when you scratch the area of a bite.  Chickenpox or other illnesses that cause open skin sores.  Nail biting or chewing. Impetigo is contagious and can spread easily from one person to another. This may occur through close skin contact or by sharing towels, clothing, or other items with a person who has the infection. RISK FACTORS Some things that can increase the risk of getting this infection include:  Playing sports that include skin-to-skin contact with others.  Having a skin condition with open sores.  Having many skin cuts or scrapes.  Living in an area that has high humidity levels.  Having poor hygiene.  Having high levels of staphylococci in your nose. SIGNS AND SYMPTOMS Impetigo usually starts out as small blisters, often on the face. The blisters then break open and turn into tiny sores (lesions) with a yellow crust. In some cases, the blisters cause itching or burning. With scratching, irritation, or lack of treatment, these small lesions may get larger. Scratching can also cause impetigo to spread to other parts of the body. The bacteria can get under the fingernails and spread when you touch another area of your skin. Other possible symptoms include:  Larger  blisters.  Pus.  Swollen lymph glands. DIAGNOSIS This condition is usually diagnosed during a physical exam. A skin sample or sample of fluid from a blister may be taken for lab tests that involve growing bacteria (culture test). This can help confirm the diagnosis or help determine the best treatment. TREATMENT Mild impetigo can be treated with prescription antibiotic cream. Oral antibiotic medicine may be used in more severe cases. Medicines for itching may also be used. HOME CARE INSTRUCTIONS  Take medicines only as directed by your health care provider.  To help prevent impetigo from spreading to other body areas:  Keep your fingernails short and clean.  Do not scratch the blisters or sores.  Cover infected areas, if necessary, to keep from scratching.  Gently wash the infected areas with antibiotic soap and water.  Soak crusted areas in warm, soapy water using antibiotic soap.  Gently rub the areas to remove crusts. Do not scrub.  Wash your hands often to avoid spreading this infection.  Stay home until you have used an antibiotic cream for 48 hours (2 days) or an oral antibiotic medicine for 24 hours (1 day). You should only return to work and activities with other people if your skin shows significant improvement. PREVENTION  To keep the infection from spreading:  Stay home until you have used an antibiotic cream for 48 hours or an oral antibiotic for 24 hours.  Wash your hands often.  Do not engage in skin-to-skin contact with other people while you have still have blisters.  Do not share towels, washcloths, or bedding with others  while you have the infection. SEEK MEDICAL CARE IF:  You develop more blisters or sores despite treatment.  Other family members get sores.  Your skin sores are not improving after 48 hours of treatment.  You have a fever. SEEK IMMEDIATE MEDICAL CARE IF:  You see spreading redness or swelling of the skin around your sores.  You  see red streaks coming from your sores.  You develop a sore throat.   This information is not intended to replace advice given to you by your health care provider. Make sure you discuss any questions you have with your health care provider.   Document Released: 05/02/2014 Document Reviewed: 05/02/2014 Elsevier Interactive Patient Education 2016 [employer] Inc[employer]  Tension Headache A tension headache is a feeling of pain, pressure, or aching that is often felt over the front and sides of the head. The pain can be dull, or it can feel tight (constricting). Tension headaches are not normally associated with nausea or vomiting, and they do not get worse with physical activity. Tension headaches can last from 30 minutes to several days. This is the most common type of headache. CAUSES The exact cause of this condition is not known. Tension headaches often begin after stress, anxiety, or depression. Other triggers may include:  Alcohol.  Too much caffeine, or caffeine withdrawal.  Respiratory infections, such as colds, flu, or sinus infections.  Dental problems or teeth clenching.  Fatigue.  Holding your head and neck in the same position for a long period of time, such as while using a computer.  Smoking. SYMPTOMS Symptoms of this condition include:  A feeling of pressure around the head.  Dull, aching head pain.  Pain felt over the front and sides of the head.  Tenderness in the muscles of the head, neck, and shoulders. DIAGNOSIS This condition may be diagnosed based on your symptoms and a physical exam. Tests may be done, such as a CT scan or an MRI of your head. These tests may be done if your symptoms are severe or unusual. TREATMENT This condition may be treated with lifestyle changes and medicines to help relieve symptoms. HOME CARE INSTRUCTIONS Managing Pain  Take over-the-counter and prescription medicines only as told by your health care provider.  Lie down in a dark,  quiet room when you have a headache.  If directed, apply ice to the head and neck area:  Put ice in a plastic bag.  Place a towel between your skin and the bag.  Leave the ice on for 20 minutes, 2-3 times per day.  Use a heating pad or a hot shower to apply heat to the head and neck area as told by your health care provider. Eating and Drinking  Eat meals on a regular schedule.  Limit alcohol use.  Decrease your caffeine intake, or stop using caffeine. General Instructions  Keep all follow-up visits as told by your health care provider. This is important.  Keep a headache journal to help find out what may trigger your headaches. For example, write down:  What you eat and drink.  How much sleep you get.  Any change to your diet or medicines.  Try massage or other relaxation techniques.  Limit stress.  Sit up straight, and avoid tensing your muscles.  Do not use tobacco products, including cigarettes, chewing tobacco, or e-cigarettes. If you need help quitting, ask your health care provider.  Exercise regularly as told by your health care provider.  Get 7-9 hours of sleep, or  the amount recommended by your health care provider. SEEK MEDICAL CARE IF:  Your symptoms are not helped by medicine.  You have a headache that is different from what you normally experience.  You have nausea or you vomit.  You have a fever. SEEK IMMEDIATE MEDICAL CARE IF:  Your headache becomes severe.  You have repeated vomiting.  You have a stiff neck.  You have a loss of vision.  You have problems with speech.  You have pain in your eye or ear.  You have muscular weakness or loss of muscle control.  You lose your balance or you have trouble walking.  You feel faint or you pass out.  You have confusion.   This information is not intended to replace advice given to you by your health care provider. Make sure you discuss any questions you have with your health care  provider.   Document Released: 04/11/2005 Document Revised: 12/31/2014 Document Reviewed: 08/04/2014 Elsevier Interactive Patient Education [employer]

## 2015-03-16 ENCOUNTER — Telehealth: Payer: Self-pay | Admitting: Certified Nurse Midwife

## 2015-03-16 NOTE — Telephone Encounter (Signed)
Spoke with patient's mother Clarene Critchleyve, okay per ROI. Mother states that the patient is having her cycles every 21 days. Is having increased bleeding and cramping with her cycles. Eve is unsure how often the patient is having to change her pad/tampon daily. States the patient is having increased blood clots and bled through her pants last week. Is taking 600 mg of ibuprofen for cramping with slight relief. Is not currently on any form of birth control. Mother feels patient may need evaluation for fibroids or endometriosis. Advised the patient will need to be seen in the office for further evaluation. Mother is agreeable. Appointment scheduled for 11/23 at 1 pm with Dr.Jerston. Agreeable to date and time. Aware if patient's bleeding increases to having to change pad/tampon every hour due to soaking through, becomes weak, dizzy, light headed, or short of breath will need to be seen earlier with our office or ED if our office is closed. Mother is agreeable.  Routing to provider for final review. Patient agreeable to disposition. Will close encounter.

## 2015-03-16 NOTE — Telephone Encounter (Signed)
Patient calling to talk with the nurse. She would like to discuss endometriosis and thyroid tumors. Patient is at work and ask if the nurse can talk with her mom. There is a dpr on file to speak with the mom.

## 2015-03-18 ENCOUNTER — Encounter: Payer: Self-pay | Admitting: Obstetrics and Gynecology

## 2015-03-18 ENCOUNTER — Ambulatory Visit (INDEPENDENT_AMBULATORY_CARE_PROVIDER_SITE_OTHER): Payer: 59 | Admitting: Obstetrics and Gynecology

## 2015-03-18 VITALS — BP 110/64 | HR 84 | Resp 16 | Wt 233.0 lb

## 2015-03-18 DIAGNOSIS — N920 Excessive and frequent menstruation with regular cycle: Secondary | ICD-10-CM

## 2015-03-18 DIAGNOSIS — N760 Acute vaginitis: Secondary | ICD-10-CM

## 2015-03-18 DIAGNOSIS — Z791 Long term (current) use of non-steroidal anti-inflammatories (NSAID): Secondary | ICD-10-CM

## 2015-03-18 DIAGNOSIS — N946 Dysmenorrhea, unspecified: Secondary | ICD-10-CM | POA: Diagnosis not present

## 2015-03-18 DIAGNOSIS — A499 Bacterial infection, unspecified: Secondary | ICD-10-CM | POA: Diagnosis not present

## 2015-03-18 DIAGNOSIS — Z3169 Encounter for other general counseling and advice on procreation: Secondary | ICD-10-CM | POA: Diagnosis not present

## 2015-03-18 DIAGNOSIS — B9689 Other specified bacterial agents as the cause of diseases classified elsewhere: Secondary | ICD-10-CM

## 2015-03-18 LAB — COMPREHENSIVE METABOLIC PANEL
ALK PHOS: 48 U/L (ref 33–115)
ALT: 18 U/L (ref 6–29)
AST: 17 U/L (ref 10–30)
Albumin: 4.2 g/dL (ref 3.6–5.1)
BUN: 10 mg/dL (ref 7–25)
CO2: 26 mmol/L (ref 20–31)
CREATININE: 0.96 mg/dL (ref 0.50–1.10)
Calcium: 9.6 mg/dL (ref 8.6–10.2)
Chloride: 104 mmol/L (ref 98–110)
Glucose, Bld: 83 mg/dL (ref 65–99)
POTASSIUM: 4 mmol/L (ref 3.5–5.3)
Sodium: 138 mmol/L (ref 135–146)
TOTAL PROTEIN: 7 g/dL (ref 6.1–8.1)
Total Bilirubin: 0.4 mg/dL (ref 0.2–1.2)

## 2015-03-18 LAB — TSH: TSH: 2.001 u[IU]/mL (ref 0.350–4.500)

## 2015-03-18 MED ORDER — METRONIDAZOLE 500 MG PO TABS: 500.0000 mg | ORAL_TABLET | Freq: Two times a day (BID) | ORAL | Status: DC

## 2015-03-18 NOTE — Patient Instructions (Signed)
Dysmenorrhea Menstrual cramps (dysmenorrhea) are caused by the muscles of the uterus tightening (contracting) during a menstrual period. For some women, this discomfort is merely bothersome. For others, dysmenorrhea can be severe enough to interfere with everyday activities for a few days each month. Primary dysmenorrhea is menstrual cramps that last a couple of days when you start having menstrual periods or soon after. This often begins after a teenager starts having her period. As a woman gets older or has a baby, the cramps will usually lessen or disappear. Secondary dysmenorrhea begins later in life, lasts longer, and the pain may be stronger than primary dysmenorrhea. The pain may start before the period and last a few days after the period.  CAUSES  Dysmenorrhea is usually caused by an underlying problem, such as:  The tissue lining the uterus grows outside of the uterus in other areas of the body (endometriosis).  The endometrial tissue, which normally lines the uterus, is found in or grows into the muscular walls of the uterus (adenomyosis).  The pelvic blood vessels are engorged with blood just before the menstrual period (pelvic congestive syndrome).  Overgrowth of cells (polyps) in the lining of the uterus or cervix.  Falling down of the uterus (prolapse) because of loose or stretched ligaments.  Depression.  Bladder problems, infection, or inflammation.  Problems with the intestine, a tumor, or irritable bowel syndrome.  Cancer of the female organs or bladder.  A severely tipped uterus.  A very tight opening or closed cervix.  Noncancerous tumors of the uterus (fibroids).  Pelvic inflammatory disease (PID).  Pelvic scarring (adhesions) from a previous surgery.  Ovarian cyst.  An intrauterine device (IUD) used for birth control. RISK FACTORS You may be at greater risk of dysmenorrhea if:  You are younger than age 62.  You started puberty early.  You have  irregular or heavy bleeding.  You have never given birth.  You have a family history of this problem.  You are a smoker. SIGNS AND SYMPTOMS   Cramping or throbbing pain in your lower abdomen.  Headaches.  Lower back pain.  Nausea or vomiting.  Diarrhea.  Sweating or dizziness.  Loose stools. DIAGNOSIS  A diagnosis is based on your history, symptoms, physical exam, diagnostic tests, or procedures. Diagnostic tests or procedures may include:  Blood tests.  Ultrasonography.  An examination of the lining of the uterus (dilation and curettage, D&C).  An examination inside your abdomen or pelvis with a scope (laparoscopy).  X-rays.  CT scan.  MRI.  An examination inside the bladder with a scope (cystoscopy).  An examination inside the intestine or stomach with a scope (colonoscopy, gastroscopy). TREATMENT  Treatment depends on the cause of the dysmenorrhea. Treatment may include:  Pain medicine prescribed by your health care provider.  Birth control pills or an IUD with progesterone hormone in it.  Hormone replacement therapy.  Nonsteroidal anti-inflammatory drugs (NSAIDs). These may help stop the production of prostaglandins.  Surgery to remove adhesions, endometriosis, ovarian cyst, or fibroids.  Removal of the uterus (hysterectomy).  Progesterone shots to stop the menstrual period.  Cutting the nerves on the sacrum that go to the female organs (presacral neurectomy).  Electric current to the sacral nerves (sacral nerve stimulation).  Antidepressant medicine.  Psychiatric therapy, counseling, or group therapy.  Exercise and physical therapy.  Meditation and yoga therapy.  Acupuncture. HOME CARE INSTRUCTIONS   Only take over-the-counter or prescription medicines as directed by your health care provider.  Place a heating pad  or hot water bottle on your lower back or abdomen. Do not sleep with the heating pad.  Use aerobic exercises, walking,  swimming, biking, and other exercises to help lessen the cramping.  Massage to the lower back or abdomen may help.  Stop smoking.  Avoid alcohol and caffeine. SEEK MEDICAL CARE IF:   Your pain does not get better with medicine.  You have pain with sexual intercourse.  Your pain increases and is not controlled with medicines.  You have abnormal vaginal bleeding with your period.  You develop nausea or vomiting with your period that is not controlled with medicine. SEEK IMMEDIATE MEDICAL CARE IF:  You pass out.    This information is not intended to replace advice given to you by your health care provider. Make sure you discuss any questions you have with your health care provider.   Document Released: 04/11/2005 Document Revised: 12/12/2012 Document Reviewed: 09/27/2012 Elsevier Interactive Patient Education 2016 Else[employer] Inc. Bacterial Vaginosis Bacterial vaginosis is a vaginal infection that occurs when the normal balance of bacteria in the vagina is disrupted. It results from an overgrowth of certain bacteria. This is the most common vaginal infection in women of childbearing age. Treatment is important to prevent complications, especially in pregnant women, as it can cause a premature delivery. CAUSES  Bacterial vaginosis is caused by an increase in harmful bacteria that are normally present in smaller amounts in the vagina. Several different kinds of bacteria can cause bacterial vaginosis. However, the reason that the condition develops is not fully understood. RISK FACTORS Certain activities or behaviors can put you at an increased risk of developing bacterial vaginosis, including:  Having a new sex partner or multiple sex partners.  Douching.  Using an intrauterine device (IUD) for contraception. Women do not get bacterial vaginosis from toilet seats, bedding, swimming pools, or contact with objects around them. SIGNS AND SYMPTOMS  Some women with bacterial vaginosis  have no signs or symptoms. Common symptoms include:  Grey vaginal discharge.  A fishlike odor with discharge, especially after sexual intercourse.  Itching or burning of the vagina and vulva.  Burning or pain with urination. DIAGNOSIS  Your health care provider will take a medical history and examine the vagina for signs of bacterial vaginosis. A sample of vaginal fluid may be taken. Your health care provider will look at this sample under a microscope to check for bacteria and abnormal cells. A vaginal pH test may also be done.  TREATMENT  Bacterial vaginosis may be treated with antibiotic medicines. These may be given in the form of a pill or a vaginal cream. A second round of antibiotics may be prescribed if the condition comes back after treatment. Because bacterial vaginosis increases your risk for sexually transmitted diseases, getting treated can help reduce your risk for chlamydia, gonorrhea, HIV, and herpes. HOME CARE INSTRUCTIONS   Only take over-the-counter or prescription medicines as directed by your health care provider.  If antibiotic medicine was prescribed, take it as directed. Make sure you finish it even if you start to feel better.  Tell all sexual partners that you have a vaginal infection. They should see their health care provider and be treated if they have problems, such as a mild rash or itching.  During treatment, it is important that you follow these instructions:  Avoid sexual activity or use condoms correctly.  Do not douche.  Avoid alcohol as directed by your health care provider.  Avoid breastfeeding as directed by your health care  provider. SEEK MEDICAL CARE IF:   Your symptoms are not improving after 3 days of treatment.  You have increased discharge or pain.  You have a fever. MAKE SURE YOU:   Understand these instructions.  Will watch your condition.  Will get help right away if you are not doing well or get worse. FOR MORE INFORMATION    Centers for Disease Control and Prevention, Division of STD Prevention: [employer] American Sexual Health Association (ASHA): www.ashastd.org    This information is not intended to replace advice given to you by your health care provider. Make sure you discuss any questions you have with your health care provider.   Document Released: 04/11/2005 Document Revised: 05/02/2014 Document Reviewed: 11/21/2012 Elsevier Interactive Patient Education [employer]

## 2015-03-18 NOTE — Progress Notes (Signed)
Patient ID: Pamela Weber, female   DOB: 03/17/1991, 24 y.o.   MRN: [id number] GYNECOLOGY  VISIT   HPI: 24 y.o.   Single  Caucasian  female   G0P0 with Patient's last menstrual period was 03/14/2015.   here c/o heavy menstrual bleeding and severe cramping. Menses q month x 5-6 days, saturates a pad in 1-2 hours (no change). She has worn depends in the past because of the flow. Cramps are very bad. She takes ibuprofen (600-800 mg q 6 hours) and if she has it she will take Vicodin at night.  She tried the pill in the past, forgot one and bleed for 21 days. Declines OCP's and IUD. She had the nexplanon, didn't like it.  The patient hasn't been using contraception for the last few years. She doesn't currently have a partner. She does have an on and off partner for 9 years, currently using condoms. For 2 years she was having unprotected intercourse and didn't get pregnant. The 2 prior partners she tried to have pregnancies with have gone on to have kids.  She has had issues with chronic BV, she does c/o a mild odor now.   GYNECOLOGIC HISTORY: Patient's last menstrual period was 03/14/2015. Contraception:Condoms  Menopausal hormone therapy: N/A        OB History    Gravida Para Term Preterm AB TAB SAB Ectopic Multiple Living   0 0                 There are no active problems to display for this patient.   Past Medical History  Diagnosis Date  . HSV-1 (herpes simplex virus 1) infection   . Chlamydia contact, treated   . Trichimoniasis 11/09  . Gallstones     Past Surgical History  Procedure Laterality Date  . Implanon      removed 12/07/11    Current Outpatient Prescriptions  Medication Sig Dispense Refill  . amphetamine-dextroamphetamine (ADDERALL XR) 20 MG 24 hr capsule Take 20 mg by mouth daily.    . meloxicam (MOBIC) 15 MG tablet TK 1 T PO D WF  5  . mupirocin ointment (BACTROBAN) 2 % Apply 1 application topically 3 (three) times daily. 22 g 1  . topiramate (TOPAMAX) 50 MG  tablet Take 1 tablet (50 mg total) by mouth at bedtime. 10 tablet 1  . valACYclovir (VALTREX) 1000 MG tablet as needed.      No current facility-administered medications for this visit.     ALLERGIES: Review of patient's allergies indicates no known allergies.  Family History  Problem Relation Age of Onset  . Diabetes Maternal Grandmother     Social History   Social History  . Marital Status: Single    Spouse Name: N/A  . Number of Children: N/A  . Years of Education: N/A   Occupational History  . Not on file.   Social History Main Topics  . Smoking status: Current Some Day Smoker -- 0.50 packs/day    Types: Cigarettes  . Smokeless tobacco: Never Used  . Alcohol Use: No  . Drug Use: No  . Sexual Activity:    Partners: Male    Birth Control/ Protection: None   Other Topics Concern  . Not on file   Social History Narrative    Review of Systems  Genitourinary:       Severe menstrual cramps Heavy menstrual bleeding  All other systems reviewed and are negative.   PHYSICAL EXAMINATION:    BP 110/64 mmHg  Pulse 84  Resp 16  Wt 233 lb (105.688 kg)  LMP 03/14/2015    General appearance: alert, cooperative and appears stated age Abdomen: soft, mildly tender in her upper abdomen; no masses,  no organomegaly  Pelvic: External genitalia:  no lesions              Urethra:  normal appearing urethra with no masses, tenderness or lesions              Bartholins and Skenes: normal                 Vagina: normal appearing vagina with watery, brown d/c, +odor              Cervix: no lesions              Bimanual Exam:  Uterus:  normal size, contour, position, consistency, mobility, non-tender              Adnexa: no mass, fullness, tenderness               Chaperone was present for exam.  Wet prep: ++ clue, no trich, no wbc KOH: no yeast PH: 5   ASSESSMENT Menorrhagia Dysmenorrhea Bacterial vaginitis Overuse of NSAID's Infertility, not currently trying to get  pregnant    PLAN CBC, TSH, CMP Treat BV Return for a gyn ultrasound Discussed the correct use of NSAID's, not giving the patient narcotics Offered OCP's, nexplanon and mirena IUD, all declined Discussed that if she does get pregnant she will need to get early evaluation to confirm IUP   An After Visit Summary was printed and given to the patient.  Over 30 minutes face to face time of which over 50% was spent in counseling.

## 2015-03-19 LAB — CBC
HEMATOCRIT: 41.2 % (ref 36.0–46.0)
HEMOGLOBIN: 14.2 g/dL (ref 12.0–15.0)
MCH: 31 pg (ref 26.0–34.0)
MCHC: 34.5 g/dL (ref 30.0–36.0)
MCV: 90 fL (ref 78.0–100.0)
MPV: 9.7 fL (ref 8.6–12.4)
Platelets: 314 10*3/uL (ref 150–400)
RBC: 4.58 MIL/uL (ref 3.87–5.11)
RDW: 12.2 % (ref 11.5–15.5)
WBC: 6.9 10*3/uL (ref 4.0–10.5)

## 2015-03-23 ENCOUNTER — Telehealth: Payer: Self-pay | Admitting: Obstetrics and Gynecology

## 2015-03-23 NOTE — Telephone Encounter (Signed)
Patient calling to schedule her ultrasound.

## 2015-03-23 NOTE — Telephone Encounter (Signed)
Called patient to review benefits for ultrasound. Left voicemail to call back and review. °

## 2015-03-23 NOTE — Telephone Encounter (Signed)
Benefits reviewed with patient for pelvic ultrasound. Patient understands and agreeable. Patient ready to schedule. Patient scheduled for 03/24/15 @4pm . Patient understands and agreeable to arrival date/time. Patient aware and agreeable to cancellation policy with $100 fee. Due to date of scheduling, patient aware to call by end of business today to cancel/reschedule in order to avoid fee. Patient agreeable. No further questions. Ok to close.

## 2015-03-24 ENCOUNTER — Encounter: Payer: Self-pay | Admitting: Obstetrics and Gynecology

## 2015-03-24 ENCOUNTER — Other Ambulatory Visit: Payer: Self-pay | Admitting: Obstetrics and Gynecology

## 2015-03-24 ENCOUNTER — Ambulatory Visit (INDEPENDENT_AMBULATORY_CARE_PROVIDER_SITE_OTHER): Payer: 59 | Admitting: Obstetrics and Gynecology

## 2015-03-24 ENCOUNTER — Ambulatory Visit (INDEPENDENT_AMBULATORY_CARE_PROVIDER_SITE_OTHER): Payer: 59

## 2015-03-24 VITALS — BP 102/64 | HR 88 | Resp 14 | Wt 233.0 lb

## 2015-03-24 DIAGNOSIS — N946 Dysmenorrhea, unspecified: Secondary | ICD-10-CM

## 2015-03-24 DIAGNOSIS — N760 Acute vaginitis: Secondary | ICD-10-CM

## 2015-03-24 DIAGNOSIS — N920 Excessive and frequent menstruation with regular cycle: Secondary | ICD-10-CM

## 2015-03-24 DIAGNOSIS — A499 Bacterial infection, unspecified: Secondary | ICD-10-CM | POA: Diagnosis not present

## 2015-03-24 DIAGNOSIS — N923 Ovulation bleeding: Secondary | ICD-10-CM

## 2015-03-24 DIAGNOSIS — N939 Abnormal uterine and vaginal bleeding, unspecified: Secondary | ICD-10-CM

## 2015-03-24 DIAGNOSIS — B9689 Other specified bacterial agents as the cause of diseases classified elsewhere: Secondary | ICD-10-CM

## 2015-03-24 MED ORDER — METRONIDAZOLE 500 MG PO TABS: 500.0000 mg | ORAL_TABLET | Freq: Two times a day (BID) | ORAL | Status: DC

## 2015-03-24 NOTE — Progress Notes (Signed)
GYNECOLOGY  VISIT   HPI: 24 y.o.   Single  Caucasian  female   G0P0 with Patient's last menstrual period was 03/14/2015.   here for evaluation of menorrhagia and dysmenorrhea. She also c/o intermenstrual spotting. Last week she was started on antibiotics for a severe case of BV, she still has 2.5 days to go of flagyl, wants to see if it has improved. The odor is getting better.  GYNECOLOGIC HISTORY: Patient's last menstrual period was 03/14/2015. Contraception: Condoms, h/o infertility Menopausal hormone therapy: N/A        OB History    Gravida Para Term Preterm AB TAB SAB Ectopic Multiple Living   0 0                 There are no active problems to display for this patient.   Past Medical History  Diagnosis Date  . HSV-1 (herpes simplex virus 1) infection   . Chlamydia contact, treated   . Trichimoniasis 11/09  . Gallstones   . ADHD (attention deficit hyperactivity disorder)   . Headache     Past Surgical History  Procedure Laterality Date  . Implanon      removed 12/07/11    Current Outpatient Prescriptions  Medication Sig Dispense Refill  . amphetamine-dextroamphetamine (ADDERALL XR) 20 MG 24 hr capsule Take 20 mg by mouth daily.    . meloxicam (MOBIC) 15 MG tablet TK 1 T PO D WF  5  . metroNIDAZOLE (FLAGYL) 500 MG tablet Take 1 tablet (500 mg total) by mouth 2 (two) times daily. 14 tablet 0  . mupirocin ointment (BACTROBAN) 2 % Apply 1 application topically 3 (three) times daily. 22 g 1  . topiramate (TOPAMAX) 50 MG tablet Take 1 tablet (50 mg total) by mouth at bedtime. 10 tablet 1  . valACYclovir (VALTREX) 1000 MG tablet as needed.      No current facility-administered medications for this visit.     ALLERGIES: Review of patient's allergies indicates no known allergies.  Family History  Problem Relation Age of Onset  . Diabetes Maternal Grandmother     Social History   Social History  . Marital Status: Single    Spouse Name: N/A  . Number of  Children: N/A  . Years of Education: N/A   Occupational History  . Not on file.   Social History Main Topics  . Smoking status: Current Some Day Smoker -- 0.50 packs/day    Types: Cigarettes  . Smokeless tobacco: Never Used  . Alcohol Use: No  . Drug Use: No  . Sexual Activity:    Partners: Male    Birth Control/ Protection: None   Other Topics Concern  . Not on file   Social History Narrative    Review of Systems  Constitutional:       Weight gain   Gastrointestinal: Positive for abdominal pain.  Genitourinary:       Painful period Heavy menstrual bleeding Irregular menstrual cycles  Neurological: Positive for headaches.  All other systems reviewed and are negative.   PHYSICAL EXAMINATION:    BP 102/64 mmHg  Pulse 88  Resp 14  Wt 233 lb (105.688 kg)  LMP 03/14/2015    General appearance: alert, cooperative and appears stated age CV: RRR Lungs: CTAB Neck: no adenopathy, supple, symmetrical, trachea midline and thyroid normal to inspection and palpation Abdomen: soft, non-tender; bowel sounds normal; no masses,  no organomegaly  Pelvic: External genitalia:  no lesions  Urethra:  normal appearing urethra with no masses, tenderness or lesions              Bartholins and Skenes: normal                 Vagina: normal appearing vagina with a slight increase in watery vaginal d/c              Cervix: no lesions               Sonohysterogram The procedure and risks of the procedure were reviewed with the patient, consent form was signed. A speculum was placed in the vagina and the cervix was cleansed with betadine. The sonohysterogram catheter was inserted into the uterine cavity without difficulty. Saline was infused under direct observation with the ultrasound. A large endometrial polyp was identified.The catheter was removed.   Chaperone was present for exam.  Wet prep: + clue, no trich, rare wbc KOH: no yeast PH: 5   ASSESSMENT Menorrhagia,  normal CBC and TSH Intermenstrual spotting Large endometrial polyp Dysmenorrhea BV, still present, but improving with 5 days of flagyl    PLAN Hysteroscopy, polypectomy, D&C Reviewed risks, including: bleeding, infection, uterine perforation Will continue Flagyl for another week Advised no NSAID's for 1 week prior to surgery    An After Visit Summary was printed and given to the patient.  25 minutes face to face time of which over 50% was spent in counseling.

## 2015-03-25 ENCOUNTER — Telehealth: Payer: Self-pay | Admitting: Obstetrics and Gynecology

## 2015-03-25 NOTE — Telephone Encounter (Signed)
She hasn't been anemic. We need to get her scheduled for surgery, hopefully prior to her next cycle. Pamela Weber should be working on that. NSAID's can slow her flow. She can take OTC ibuprofen or her mobic (not both).

## 2015-03-25 NOTE — Telephone Encounter (Signed)
Left detailed message at number provided [phone], okay per ROI. Advised of message as seen below from Dr.Jertson. Advised to return call with further questions or concerns.  Cc: Billie RuddySally Yeakley, RN  Routing to provider for final review. Patient agreeable to disposition. Will close encounter.

## 2015-03-25 NOTE — Telephone Encounter (Signed)
Spoke with patient. Patient was seen in the office yesterday for a PUS. Patient states that she started bleeding "like my normal period" today. LMP was 03/14/2015. Denies any heavy bleeding, pain , or discomfort. Patient is requesting recommendations on what she should do to help with her bleeding. Advised will speak with Dr.Jertson regarding her symptoms and return call. Patient is agreeable.

## 2015-03-25 NOTE — Telephone Encounter (Signed)
Left message to call Payden Docter at [phone]. 

## 2015-03-25 NOTE — Telephone Encounter (Signed)
Patient wants to speak with the nurse she has some questions no information given.

## 2015-03-26 ENCOUNTER — Telehealth: Payer: Self-pay | Admitting: Obstetrics and Gynecology

## 2015-03-26 NOTE — H&P (Signed)
HPI: 24 y.o. Single Caucasian female  G0P0 with Patient's last menstrual period was 03/14/2015.  here for evaluation of menorrhagia and dysmenorrhea. She also c/o intermenstrual spotting. Last week she was started on antibiotics for a severe case of BV, she still has 2.5 days to go of flagyl, wants to see if it has improved. The odor is getting better.  GYNECOLOGIC HISTORY: Patient's last menstrual period was 03/14/2015. Contraception: Condoms, h/o infertility Menopausal hormone therapy: N/A   OB History    Gravida Para Term Preterm AB TAB SAB Ectopic Multiple Living   0 0               There are no active problems to display for this patient.   Past Medical History  Diagnosis Date  . HSV-1 (herpes simplex virus 1) infection   . Chlamydia contact, treated   . Trichimoniasis 11/09  . Gallstones   . ADHD (attention deficit hyperactivity disorder)   . Headache     Past Surgical History  Procedure Laterality Date  . Implanon      removed 12/07/11    Current Outpatient Prescriptions  Medication Sig Dispense Refill  . amphetamine-dextroamphetamine (ADDERALL XR) 20 MG 24 hr capsule Take 20 mg by mouth daily.    . meloxicam (MOBIC) 15 MG tablet TK 1 T PO D WF  5  . metroNIDAZOLE (FLAGYL) 500 MG tablet Take 1 tablet (500 mg total) by mouth 2 (two) times daily. 14 tablet 0  . mupirocin ointment (BACTROBAN) 2 % Apply 1 application topically 3 (three) times daily. 22 g 1  . topiramate (TOPAMAX) 50 MG tablet Take 1 tablet (50 mg total) by mouth at bedtime. 10 tablet 1  . valACYclovir (VALTREX) 1000 MG tablet as needed.      No current facility-administered medications for this visit.     ALLERGIES: Review of patient's allergies indicates no known allergies.  Family History  Problem Relation Age of Onset  . Diabetes Maternal Grandmother     Social  History   Social History  . Marital Status: Single    Spouse Name: N/A  . Number of Children: N/A  . Years of Education: N/A   Occupational History  . Not on file.   Social History Main Topics  . Smoking status: Current Some Day Smoker -- 0.50 packs/day    Types: Cigarettes  . Smokeless tobacco: Never Used  . Alcohol Use: No  . Drug Use: No  . Sexual Activity:    Partners: Male    Birth Control/ Protection: None   Other Topics Concern  . Not on file   Social History Narrative    Review of Systems  Constitutional:   Weight gain  Gastrointestinal: Positive for abdominal pain.  Genitourinary:   Painful period Heavy menstrual bleeding Irregular menstrual cycles  Neurological: Positive for headaches.  All other systems reviewed and are negative.   PHYSICAL EXAMINATION:   BP 102/64 mmHg  Pulse 88  Resp 14  Wt 233 lb (105.688 kg)  LMP 03/14/2015  General appearance: alert, cooperative and appears stated age CV: RRR Lungs: CTAB Neck: no adenopathy, supple, symmetrical, trachea midline and thyroid normal to inspection and palpation Abdomen: soft, non-tender; bowel sounds normal; no masses, no organomegaly  Pelvic: External genitalia: no lesions  Urethra: normal appearing urethra with no masses, tenderness or lesions  Bartholins and Skenes: normal   Vagina: normal appearing vagina with a slight increase in watery vaginal d/c  Cervix: no lesions   Sonohysterogram The procedure  and risks of the procedure were reviewed with the patient, consent form was signed. A speculum was placed in the vagina and the cervix was cleansed with betadine. The sonohysterogram catheter was inserted into the uterine cavity without difficulty. Saline was infused under direct observation with the ultrasound. A large endometrial polyp was identified.The  catheter was removed.   Chaperone was present for exam.  Wet prep: + clue, no trich, rare wbc KOH: no yeast PH: 5   ASSESSMENT Menorrhagia, normal CBC and TSH Intermenstrual spotting Large endometrial polyp Dysmenorrhea BV, still present, but improving with 5 days of flagyl   PLAN Hysteroscopy, polypectomy, D&C Reviewed risks, including: bleeding, infection, uterine perforation Will continue Flagyl for another week Advised no NSAID's for 1 week prior to surgery   An After Visit Summary was printed and given to the patient.  25 minutes face to face time of which over 50% was spent in counseling.

## 2015-03-26 NOTE — Telephone Encounter (Signed)
Returned call. Left voicemail to return for benefit review.

## 2015-03-26 NOTE — Telephone Encounter (Signed)
Attempted call to discuss surgery benefit with patient. Left voicemail to return call.

## 2015-03-26 NOTE — Telephone Encounter (Signed)
Discussed surgery benefit with patient. Patient understands and has questions regarding scheduling dates with regards to her work schedule. Informed patient i would discuss with surgery scheduler and return call. Discussed with Billie RuddySally Yeakley with regards to patient concerns regarding dates in relation to her cycle. Stated she would discuss with provider. Patient called at 4pm to check status of dates. Per Kennon RoundsSally, 04/01/15 available. Offered to patient. Patient would like 04/01/15. Patient states she will contact office tomorrow with payment.  Kennon RoundsSally notified. Patient aware when she calls office tomorrow - pre surgery instructions will be provided. Patient agreeable.  Routing to Seat PleasantSally for review.

## 2015-03-26 NOTE — Telephone Encounter (Signed)
Returned call

## 2015-03-27 ENCOUNTER — Encounter (HOSPITAL_COMMUNITY): Payer: Self-pay

## 2015-03-27 NOTE — Telephone Encounter (Signed)
Call to patient to review surgery instructions. Left message to call back.  

## 2015-03-27 NOTE — Telephone Encounter (Signed)
Call to patient regarding benefit for surgery 04/01/15. Patient answered and immediately requested call to mother instead based on patient being at work. Patient stated mother is authorized to receive information. Call to patients mother. Patient identified. Benefit information discussed - professional benefit only. Aware and agreeable to cancellation policy. Hospital will contact separately - preservice center contact information provided. Payment received via phone. Patients mother inquired regarding surgical instructions. Reassured that our nurse will contact patient with these instructions. Agreeable. No further questions. Routing to [employer]ally Yeakley for review.

## 2015-03-30 NOTE — Telephone Encounter (Signed)
I contacted [place] and she was at work. She asked that I call her mother, Clarene Critchleyve, and give pre opt info. Eve is on her DPR. I spoke with Patients mother and gave pre opt instructions -eh

## 2015-04-01 ENCOUNTER — Ambulatory Visit (HOSPITAL_COMMUNITY): Payer: 59 | Admitting: Anesthesiology

## 2015-04-01 ENCOUNTER — Encounter (HOSPITAL_COMMUNITY)
Admission: RE | Disposition: A | Discharge status: Home / Self Care | Payer: Self-pay | Source: Ambulatory Visit | Attending: Obstetrics and Gynecology

## 2015-04-01 ENCOUNTER — Ambulatory Visit (HOSPITAL_COMMUNITY)
Admission: RE | Admit: 2015-04-01 | Discharge: 2015-04-01 | Disposition: A | Discharge status: Home / Self Care | Payer: 59 | Source: Ambulatory Visit | Attending: Obstetrics and Gynecology | Admitting: Obstetrics and Gynecology

## 2015-04-01 DIAGNOSIS — N840 Polyp of corpus uteri: Secondary | ICD-10-CM | POA: Insufficient documentation

## 2015-04-01 DIAGNOSIS — N946 Dysmenorrhea, unspecified: Secondary | ICD-10-CM | POA: Diagnosis not present

## 2015-04-01 DIAGNOSIS — F17210 Nicotine dependence, cigarettes, uncomplicated: Secondary | ICD-10-CM | POA: Insufficient documentation

## 2015-04-01 DIAGNOSIS — N920 Excessive and frequent menstruation with regular cycle: Secondary | ICD-10-CM | POA: Insufficient documentation

## 2015-04-01 DIAGNOSIS — N923 Ovulation bleeding: Secondary | ICD-10-CM | POA: Insufficient documentation

## 2015-04-01 DIAGNOSIS — N939 Abnormal uterine and vaginal bleeding, unspecified: Secondary | ICD-10-CM | POA: Insufficient documentation

## 2015-04-01 HISTORY — PX: DILATATION & CURETTAGE/HYSTEROSCOPY WITH MYOSURE: SHX6511

## 2015-04-01 LAB — CBC
HCT: 40.7 % (ref 36.0–46.0)
HEMOGLOBIN: 14.4 g/dL (ref 12.0–15.0)
MCH: 31.1 pg (ref 26.0–34.0)
MCHC: 35.4 g/dL (ref 30.0–36.0)
MCV: 87.9 fL (ref 78.0–100.0)
Platelets: 319 10*3/uL (ref 150–400)
RBC: 4.63 MIL/uL (ref 3.87–5.11)
RDW: 11.8 % (ref 11.5–15.5)
WBC: 7.1 10*3/uL (ref 4.0–10.5)

## 2015-04-01 LAB — PREGNANCY, URINE: Preg Test, Ur: NEGATIVE

## 2015-04-01 SURGERY — DILATATION & CURETTAGE/HYSTEROSCOPY WITH MYOSURE
Anesthesia: General | Site: Vagina

## 2015-04-01 MED ORDER — PROPOFOL 10 MG/ML IV BOLUS
INTRAVENOUS | Status: AC
Start: 2015-04-01 — End: 2015-04-01
  Filled 2015-04-01: qty 20

## 2015-04-01 MED ORDER — FENTANYL CITRATE (PF) 100 MCG/2ML IJ SOLN
INTRAMUSCULAR | Status: DC
Start: 2015-04-01 — End: 2015-04-01
  Filled 2015-04-01: qty 2

## 2015-04-01 MED ORDER — PROMETHAZINE HCL 25 MG/ML IJ SOLN: 6.2500 mg | INTRAMUSCULAR | Status: DC | PRN

## 2015-04-01 MED ORDER — MIDAZOLAM HCL 2 MG/2ML IJ SOLN
INTRAMUSCULAR | Status: DC | PRN
  Administered 2015-04-01: 2 mg via INTRAVENOUS

## 2015-04-01 MED ORDER — MIDAZOLAM HCL 2 MG/2ML IJ SOLN
INTRAMUSCULAR | Status: AC
  Filled 2015-04-01: qty 2

## 2015-04-01 MED ORDER — SCOPOLAMINE 1 MG/3DAYS TD PT72: 1.0000 | MEDICATED_PATCH | Freq: Once | TRANSDERMAL | Status: DC

## 2015-04-01 MED ORDER — PROPOFOL 10 MG/ML IV BOLUS
INTRAVENOUS | Status: DC | PRN
  Administered 2015-04-01: 150 mg via INTRAVENOUS
  Administered 2015-04-01: 50 mg via INTRAVENOUS

## 2015-04-01 MED ORDER — FENTANYL CITRATE (PF) 100 MCG/2ML IJ SOLN
25.0000 ug | INTRAMUSCULAR | Status: DC | PRN
  Administered 2015-04-01: 50 ug via INTRAVENOUS

## 2015-04-01 MED ORDER — FENTANYL CITRATE (PF) 100 MCG/2ML IJ SOLN
INTRAMUSCULAR | Status: DC | PRN
  Administered 2015-04-01: 100 ug via INTRAVENOUS
  Administered 2015-04-01 (×2): 50 ug via INTRAVENOUS
  Administered 2015-04-01 (×2): 25 ug via INTRAVENOUS

## 2015-04-01 MED ORDER — ONDANSETRON HCL 4 MG/2ML IJ SOLN
INTRAMUSCULAR | Status: AC
  Filled 2015-04-01: qty 2

## 2015-04-01 MED ORDER — LACTATED RINGERS IV SOLN
INTRAVENOUS | Status: DC
  Administered 2015-04-01 (×2): via INTRAVENOUS

## 2015-04-01 MED ORDER — ONDANSETRON HCL 4 MG/2ML IJ SOLN
INTRAMUSCULAR | Status: DC | PRN
  Administered 2015-04-01: 4 mg via INTRAVENOUS

## 2015-04-01 MED ORDER — HYDROCODONE-ACETAMINOPHEN 5-325 MG PO TABS
1.0000 | ORAL_TABLET | Freq: Once | ORAL | Status: AC
  Administered 2015-04-01: 1 via ORAL

## 2015-04-01 MED ORDER — HYDROCODONE-ACETAMINOPHEN 5-325 MG PO TABS
ORAL_TABLET | ORAL | Status: AC
  Administered 2015-04-01: 1 via ORAL
  Filled 2015-04-01: qty 1

## 2015-04-01 MED ORDER — DEXAMETHASONE SODIUM PHOSPHATE 4 MG/ML IJ SOLN
INTRAMUSCULAR | Status: AC
  Filled 2015-04-01: qty 1

## 2015-04-01 MED ORDER — KETOROLAC TROMETHAMINE 30 MG/ML IJ SOLN
INTRAMUSCULAR | Status: AC
  Filled 2015-04-01: qty 1

## 2015-04-01 MED ORDER — LIDOCAINE HCL (CARDIAC) 20 MG/ML IV SOLN
INTRAVENOUS | Status: AC
Start: 2015-04-01 — End: 2015-04-01
  Filled 2015-04-01: qty 5

## 2015-04-01 MED ORDER — KETOROLAC TROMETHAMINE 30 MG/ML IJ SOLN
INTRAMUSCULAR | Status: DC | PRN
  Administered 2015-04-01: 30 mg via INTRAVENOUS

## 2015-04-01 MED ORDER — FENTANYL CITRATE (PF) 250 MCG/5ML IJ SOLN
INTRAMUSCULAR | Status: AC
Start: 2015-04-01 — End: 2015-04-01
  Filled 2015-04-01: qty 5

## 2015-04-01 MED ORDER — SODIUM CHLORIDE 0.9 % IR SOLN
Status: DC | PRN
  Administered 2015-04-01: 3000 mL

## 2015-04-01 MED ORDER — LACTATED RINGERS IV SOLN: INTRAVENOUS | Status: DC

## 2015-04-01 MED ORDER — LIDOCAINE HCL (CARDIAC) 20 MG/ML IV SOLN
INTRAVENOUS | Status: DC | PRN
  Administered 2015-04-01: 100 mg via INTRAVENOUS

## 2015-04-01 MED ORDER — MEPERIDINE HCL 25 MG/ML IJ SOLN: 6.2500 mg | INTRAMUSCULAR | Status: DC | PRN

## 2015-04-01 MED ORDER — DEXAMETHASONE SODIUM PHOSPHATE 10 MG/ML IJ SOLN
INTRAMUSCULAR | Status: DC | PRN
  Administered 2015-04-01: 4 mg via INTRAVENOUS

## 2015-04-01 SURGICAL SUPPLY — 25 items
CANISTER SUCT 3000ML (MISCELLANEOUS) ×3 IMPLANT
CATH ROBINSON RED A/P 16FR (CATHETERS) ×1 IMPLANT
CLOTH BEACON ORANGE TIMEOUT ST (SAFETY) ×3 IMPLANT
CONTAINER PREFILL 10% NBF 60ML (FORM) ×6 IMPLANT
DEVICE MYOSURE LITE (MISCELLANEOUS) IMPLANT
DEVICE MYOSURE REACH (MISCELLANEOUS) IMPLANT
FILTER ARTHROSCOPY CONVERTOR (FILTER) ×3 IMPLANT
GLOVE BIO SURGEON STRL SZ 6.5 (GLOVE) ×2 IMPLANT
GLOVE BIO SURGEONS STRL SZ 6.5 (GLOVE) ×1
GLOVE BIOGEL PI IND STRL 7.0 (GLOVE) ×2 IMPLANT
GLOVE BIOGEL PI INDICATOR 7.0 (GLOVE) ×4
GOWN STRL REUS W/TWL LRG LVL3 (GOWN DISPOSABLE) ×6 IMPLANT
KIT BERKELEY 1ST TRIMESTER 3/8 (MISCELLANEOUS) ×3 IMPLANT
MYOSURE XL FIBROID REM (MISCELLANEOUS)
PACK VAGINAL MINOR WOMEN LF (CUSTOM PROCEDURE TRAY) ×3 IMPLANT
PAD OB MATERNITY 4.3X12.25 (PERSONAL CARE ITEMS) ×3 IMPLANT
SEAL ROD LENS SCOPE MYOSURE (ABLATOR) ×3 IMPLANT
SET BERKELEY SUCTION TUBING (SUCTIONS) ×2 IMPLANT
SYSTEM TISS REMOVAL MYSR XL RM (MISCELLANEOUS) IMPLANT
TOP DISP BERKELEY (MISCELLANEOUS) ×2 IMPLANT
TOWEL OR 17X24 6PK STRL BLUE (TOWEL DISPOSABLE) ×6 IMPLANT
TUBING AQUILEX INFLOW (TUBING) ×3 IMPLANT
TUBING AQUILEX OUTFLOW (TUBING) ×3 IMPLANT
VACURETTE 6 ASPIR F TIP BERK (CANNULA) ×3 IMPLANT
WATER STERILE IRR 1000ML POUR (IV SOLUTION) ×3 IMPLANT

## 2015-04-01 NOTE — Op Note (Signed)
Preoperative Diagnosis: Abnormal uterine bleeding, endometrial polyp  Postoperative Diagnosis: abnormal uterine bleeding  Procedure: Hysteroscopy, dilation and curettage  Surgeon: Dr Gertie ExonJill Jertson  Assistants: None  Anesthesia: General via LMA  EBL: minimal   Fluids: 1,200 cc LR  Fluid deficit: 115  Urine output: not recorded  Indications for surgery: The patient is a 24 yo female, who presented with abnormal uterine bleeding. Work up included an ultrasound that was concerning for an endometrial polyp. Sonohysterogram confirmed a large intrauterine polyp.  The risks of the surgery were reviewed with the patient and the consent form was signed prior to her surgery.  Findings: Normal sized anteverted uterus, no adnexal masses. Hysteroscopy with thickened endometrium, no clear polyp seen   Specimens: endometrial curettings   Procedure: The patient was taken to the operating room with an IV in place. She was placed in the dorsal lithotomy position and anesthesia was administered. She was prepped and draped in the usual sterile fashion for a vaginal procedure. She voided on the way to the OR. A weighted speculum was placed in the vagina and a single tooth tenaculum was placed on the anterior lip of the cervix. The cervix was dilated to a #7  hagar dilator. The uterus was sounded to 7 cm. The myosure hysteroscope was inserted into the uterine cavity. With continuous infusion of normal saline, the uterine cavity was visualized with the above findings.  The myosure hysteroscope was then removed and the cavity was curetted with a #6 flexible suction curette. The cavity was then curetted with the small sharp curette. The cavity had the characteristically gritty texture at the end of the procedure. The hysteroscope was reinserted into the uterine cavity and the cavity was empty. The hysteroscope and the single tooth tenaculum were removed. Oozing from the tenaculum site was stopped with pressure. The  speculum was removed. The patients perineum was cleansed of betadine and she was taken out of the dorsal lithotomy position.  Upon awakening the LMA was removed and the patient was transferred to the recovery room in stable and awake condition.  The sponge and instrument count were correct. There were no complications.

## 2015-04-01 NOTE — Anesthesia Procedure Notes (Signed)
Procedure Name: LMA Insertion Date/Time: 04/01/2015 8:47 AM Performed by: Cephus ShellingBURGER, Tawfiq Favila A Pre-anesthesia Checklist: Patient being monitored, Patient identified, Emergency Drugs available and Suction available Patient Re-evaluated:Patient Re-evaluated prior to inductionOxygen Delivery Method: Circle system utilized Preoxygenation: Pre-oxygenation with 100% oxygen Intubation Type: IV induction and Inhalational induction Ventilation: Mask ventilation without difficulty LMA: LMA inserted LMA Size: 4.0 Number of attempts: 1 Dental Injury: Teeth and Oropharynx as per pre-operative assessment

## 2015-04-01 NOTE — Anesthesia Preprocedure Evaluation (Addendum)
Anesthesia Evaluation  Patient identified by MRN, date of birth, ID band Patient awake    Reviewed: Allergy & Precautions, NPO status , Patient's Chart, lab work & pertinent test results  Airway Mallampati: III  TM Distance: >3 FB Neck ROM: Full    Dental  (+) Teeth Intact   Pulmonary Current Smoker,    breath sounds clear to auscultation       Cardiovascular negative cardio ROS   Rhythm:Regular Rate:Normal     Neuro/Psych  Headaches, PSYCHIATRIC DISORDERS    GI/Hepatic negative GI ROS, Neg liver ROS,   Endo/Other  negative endocrine ROS  Renal/GU negative Renal ROS  negative genitourinary   Musculoskeletal negative musculoskeletal ROS (+)   Abdominal   Peds negative pediatric ROS (+)  Hematology negative hematology ROS (+)   Anesthesia Other Findings   Reproductive/Obstetrics negative OB ROS                           Lab Results  Component Value Date   WBC 6.9 03/18/2015   HGB 14.2 03/18/2015   HCT 41.2 03/18/2015   MCV 90.0 03/18/2015   PLT 314 03/18/2015   Lab Results  Component Value Date   CREATININE 0.96 03/18/2015   BUN 10 03/18/2015   NA 138 03/18/2015   K 4.0 03/18/2015   CL 104 03/18/2015   CO2 26 03/18/2015   No results found for: INR, PROTIME   Anesthesia Physical Anesthesia Plan  ASA: II  Anesthesia Plan: General   Post-op Pain Management:    Induction: Intravenous  Airway Management Planned: LMA  Additional Equipment:   Intra-op Plan:   Post-operative Plan: Extubation in OR  Informed Consent: I have reviewed the patients History and Physical, chart, labs and discussed the procedure including the risks, benefits and alternatives for the proposed anesthesia with the patient or authorized representative who has indicated his/her understanding and acceptance.   Dental advisory given  Plan Discussed with: CRNA  Anesthesia Plan Comments:         Anesthesia Quick Evaluation

## 2015-04-01 NOTE — Transfer of Care (Signed)
Immediate Anesthesia Transfer of Care Note  [name]  Procedure(s) Performed: Procedure(s): DILATATION & CURETTAGE/HYSTEROSCOPY with Suction (N/A)  Patient Location: PACU  Anesthesia Type:General  Level of Consciousness: awake  Airway & Oxygen Therapy: Patient Spontanous Breathing  Post-op Assessment: Report given to PACU RN  Post vital signs: stable  Filed Vitals:   04/01/15 0747  Pulse: 92  Temp: 37.2 C  Resp: 20    Complications: No apparent anesthesia complications'

## 2015-04-01 NOTE — Anesthesia Postprocedure Evaluation (Signed)
Anesthesia Post Note  [facility]  Procedure(s) Performed: Procedure(s) (LRB): DILATATION & CURETTAGE/HYSTEROSCOPY with Suction (N/A)  Patient location during evaluation: PACU Anesthesia Type: General Level of consciousness: awake and alert Pain management: pain level controlled Vital Signs Assessment: post-procedure vital signs reviewed and stable Respiratory status: spontaneous breathing, nonlabored ventilation, respiratory function stable and patient connected to nasal cannula oxygen Cardiovascular status: blood pressure returned to baseline and stable Postop Assessment: no signs of nausea or vomiting Anesthetic complications: no    Last Vitals:  Filed Vitals:   04/01/15 0945 04/01/15 1000  BP: 112/73 116/71  Pulse: 78 72  Temp:    Resp: 13 16    Last Pain: There were no vitals filed for this visit.               Shelton SilvasKevin D Jillane Po

## 2015-04-01 NOTE — Interval H&P Note (Signed)
History and Physical Interval Note:  04/01/2015 8:29 AM  [facility]  has presented today for surgery, with the diagnosis of menorrhagia, intermenstrual spotting, dysmenorrhea  The various methods of treatment have been discussed with the patient and family. After consideration of risks, benefits and other options for treatment, the patient has consented to  Procedure(s): DILATATION & CURETTAGE/HYSTEROSCOPY WITH MYOSURE (N/A) as a surgical intervention .  The patient's history has been reviewed, patient examined, no change in status, stable for surgery.  I have reviewed the patient's chart and labs.  Questions were answered to the patient's satisfaction.     [name]

## 2015-04-01 NOTE — Discharge Instructions (Signed)

## 2015-04-02 ENCOUNTER — Encounter (HOSPITAL_COMMUNITY): Payer: Self-pay | Admitting: Obstetrics and Gynecology

## 2015-04-06 ENCOUNTER — Telehealth: Payer: Self-pay | Admitting: Obstetrics and Gynecology

## 2015-04-06 NOTE — Telephone Encounter (Signed)
Called patient to review benign pathology. Left a message to call (on her cell phone)

## 2015-04-06 NOTE — Telephone Encounter (Signed)
Patient returned call and she is given message from Dr. Oscar LaJertson.  Patient denies complaints and verbalized understanding of message. She will call back as needed. Routing to provider for final review. Patient agreeable to disposition. Will close encounter.

## 2015-04-15 ENCOUNTER — Encounter: Payer: Self-pay | Admitting: Obstetrics and Gynecology

## 2015-04-15 ENCOUNTER — Ambulatory Visit (INDEPENDENT_AMBULATORY_CARE_PROVIDER_SITE_OTHER): Payer: 59 | Admitting: Obstetrics and Gynecology

## 2015-04-15 VITALS — BP 118/82 | HR 80 | Resp 16 | Wt 233.0 lb

## 2015-04-15 DIAGNOSIS — N939 Abnormal uterine and vaginal bleeding, unspecified: Secondary | ICD-10-CM | POA: Diagnosis not present

## 2015-04-15 DIAGNOSIS — Z9889 Other specified postprocedural states: Secondary | ICD-10-CM

## 2015-04-15 NOTE — Progress Notes (Signed)
Patient ID: Pamela Weber, female   DOB: 02/19/1991, 24 y.o.   MRN: [id number] Post Operative Visit  Procedure:D&C hysteroscopy  Days Post-op: 14 days   Subjective: The patient is feeling fine. Pre-operatively she had a large endometrial polyp on ultrasound and sonohysterogram. No polyp seen at hysteroscopy. She feels she passed it the day before surgery, she had bleeding and cramping (outside of her cycle) and passed a large piece of clot/tissue. [facility] had a lighter less crampy cycle than typical for her.   Past Medical History  Diagnosis Date  . HSV-1 (herpes simplex virus 1) infection   . Chlamydia contact, treated   . Trichimoniasis 11/09  . Gallstones   . ADHD (attention deficit hyperactivity disorder)   . Headache    Past Surgical History  Procedure Laterality Date  . Implanon      removed 12/07/11  . Wisdom tooth extraction    . Closed reduction nasal fracture    . Dilatation & curettage/hysteroscopy with myosure N/A 04/01/2015    Procedure: DILATATION & CURETTAGE/HYSTEROSCOPY with Suction;  Surgeon: Romualdo BolkJill Evelyn Jertson, MD;  Location: [facility];  Service: Gynecology;  Laterality: N/A;  . Hysteroscopy       Objective: BP 118/82 mmHg  Pulse 80  Resp 16  Wt 233 lb (105.688 kg)  LMP 04/07/2015  EXAM General: Alert female  Abd: soft, not tender, no masses BM: uterus normal sized, mobile, not tender. No adnexal masses or tenderness  Assessment: s/p hysteroscopy, D&C. She apparently passed the polyp prior to the procedure  Plan: Calendar cycles, check in in 2-3 months Declines contraception  Review of Systems  All other systems reviewed and are negative.

## 2015-05-12 ENCOUNTER — Ambulatory Visit (INDEPENDENT_AMBULATORY_CARE_PROVIDER_SITE_OTHER): Payer: 59 | Admitting: Certified Nurse Midwife

## 2015-05-12 ENCOUNTER — Encounter: Payer: Self-pay | Admitting: Certified Nurse Midwife

## 2015-05-12 VITALS — BP 102/70 | HR 70 | Resp 16 | Ht 66.25 in | Wt 235.0 lb

## 2015-05-12 DIAGNOSIS — N9489 Other specified conditions associated with female genital organs and menstrual cycle: Secondary | ICD-10-CM

## 2015-05-12 DIAGNOSIS — N760 Acute vaginitis: Secondary | ICD-10-CM

## 2015-05-12 DIAGNOSIS — N898 Other specified noninflammatory disorders of vagina: Secondary | ICD-10-CM

## 2015-05-12 DIAGNOSIS — A499 Bacterial infection, unspecified: Secondary | ICD-10-CM

## 2015-05-12 DIAGNOSIS — B9689 Other specified bacterial agents as the cause of diseases classified elsewhere: Secondary | ICD-10-CM

## 2015-05-12 MED ORDER — METRONIDAZOLE 500 MG PO TABS: 500.0000 mg | ORAL_TABLET | Freq: Two times a day (BID) | ORAL | Status: DC

## 2015-05-12 NOTE — Progress Notes (Signed)
25 yo white female  g0p0 here with complaint of vaginal symptoms of odorous,and increase discharge. Describes discharge as clear watery.. Onset of symptoms 2 days ago. Denies new personal products No STD concerns. Urinary symptoms none . Contraception is condoms consistent. History of chronic BV. No partner change. No other health issues today.    O:Healthy female WDWN Affect: normal, orientation x 3  Exam: Abdomen:soft,non tender. Lymph node: no enlargement or tenderness Pelvic exam: External genital: normal female BUS: negative Vagina: clear watery discharge noted.  Affirm taken Cervix: normal, non tender, no CMT Uterus: normal, non tender Adnexa:normal, non tender, no masses or fullness noted    A:Normal pelvic exam History of chronic BV   P:Discussed findings of Bv appearance and etiology. Discussed Aveeno or baking soda sitz bath for comfort. Will confirm with affirm and will treat other if indicated. Rx: Flagyl see order.  Will discuss chronic treatment again at [facility] in 2 weeks and do TOC. Patient agreeable and happy to work on avoiding again.  Rv prn

## 2015-05-12 NOTE — Patient Instructions (Signed)

## 2015-05-13 LAB — WET PREP BY MOLECULAR PROBE
CANDIDA SPECIES: NEGATIVE
GARDNERELLA VAGINALIS: POSITIVE — AB
TRICHOMONAS VAG: NEGATIVE

## 2015-05-18 NOTE — Progress Notes (Signed)
Reviewed personally.  M. Suzanne Wyndell Cardiff, MD.  

## 2015-05-20 ENCOUNTER — Telehealth: Payer: Self-pay | Admitting: Certified Nurse Midwife

## 2015-05-20 MED ORDER — FLUCONAZOLE 150 MG PO TABS: 150.0000 mg | ORAL_TABLET | Freq: Once | ORAL | Status: DC

## 2015-05-20 NOTE — Telephone Encounter (Signed)
Patient was put on antibiotics and has a yeast infection. Offered an appointment but declined and would like nurse to call her.

## 2015-05-20 NOTE — Telephone Encounter (Signed)
Spoke with patient. Advised rx for Diflucan 150 mg #2 0RF take one tablet by mouth repeat in 48 if symptoms persist sent to pharmacy on file. Patient is agreeable.  Routing to provider for final review. Patient agreeable to disposition. Will close encounter.

## 2015-05-20 NOTE — Telephone Encounter (Signed)
Patient was started on Flagyl 500 mg bid x 7 days on 05/12/2015 for treatment of BV. Patient has completed her Flagyl and now has symptoms of a yeast infection. Patient is requesting rx for Diflucan. Routing to [employer] CNM for review.  Leota Sauers CNM okay for Diflucan 150 mg #2 0RF?

## 2015-05-20 NOTE — Telephone Encounter (Signed)
Yes ok to send.  

## 2015-06-11 ENCOUNTER — Encounter: Payer: Self-pay | Admitting: Certified Nurse Midwife

## 2015-06-11 ENCOUNTER — Ambulatory Visit (INDEPENDENT_AMBULATORY_CARE_PROVIDER_SITE_OTHER): Payer: 59 | Admitting: Certified Nurse Midwife

## 2015-06-11 VITALS — BP 102/64 | HR 68 | Resp 16 | Ht 66.5 in | Wt 232.0 lb

## 2015-06-11 DIAGNOSIS — Z308 Encounter for other contraceptive management: Secondary | ICD-10-CM

## 2015-06-11 DIAGNOSIS — Z789 Other specified health status: Secondary | ICD-10-CM

## 2015-06-11 DIAGNOSIS — Z0000 Encounter for general adult medical examination without abnormal findings: Secondary | ICD-10-CM

## 2015-06-11 DIAGNOSIS — R635 Abnormal weight gain: Secondary | ICD-10-CM | POA: Diagnosis not present

## 2015-06-11 DIAGNOSIS — Z01419 Encounter for gynecological examination (general) (routine) without abnormal findings: Secondary | ICD-10-CM

## 2015-06-11 DIAGNOSIS — R198 Other specified symptoms and signs involving the digestive system and abdomen: Secondary | ICD-10-CM | POA: Diagnosis not present

## 2015-06-11 LAB — POCT URINALYSIS DIPSTICK
BILIRUBIN UA: NEGATIVE
GLUCOSE UA: NEGATIVE
KETONES UA: NEGATIVE
Leukocytes, UA: NEGATIVE
Nitrite, UA: NEGATIVE
Protein, UA: NEGATIVE
RBC UA: NEGATIVE
Urobilinogen, UA: NEGATIVE
pH, UA: 5

## 2015-06-11 LAB — CBC
HCT: 43.7 % (ref 36.0–46.0)
HEMOGLOBIN: 14.6 g/dL (ref 12.0–15.0)
MCH: 30.3 pg (ref 26.0–34.0)
MCHC: 33.4 g/dL (ref 30.0–36.0)
MCV: 90.7 fL (ref 78.0–100.0)
MPV: 9.7 fL (ref 8.6–12.4)
Platelets: 337 10*3/uL (ref 150–400)
RBC: 4.82 MIL/uL (ref 3.87–5.11)
RDW: 12.4 % (ref 11.5–15.5)
WBC: 8.4 10*3/uL (ref 4.0–10.5)

## 2015-06-11 LAB — POCT URINE PREGNANCY: PREG TEST UR: NEGATIVE

## 2015-06-11 NOTE — Progress Notes (Signed)
25 y.o. G0P0 Single  Caucasian Fe here for annual exam. Periods normal, no issues now. Partner change, desires STD screening. Treated for BV and Diflucan for yeast and BV 05/12/15, no symptoms now. No HSV outbreaks has Valtrex for use if needed. Staying busy with work. Sees PCP for Adderall and Topamax medication management and aex, labs. Sees PCP three times yearly. No dosage change. Patient has noted slight redness around "belly button", no pain or pus noted. Please check. No other health issues today.   Patient's last menstrual period was 05/31/2015.          Sexually active: Yes.    The current method of family planning is condoms most of the time.    Exercising: No.  exercise Smoker:  no  Health Maintenance: Pap:  05-22-14 neg MMG:  none Colonoscopy:  none BMD:   none TDaP:  2008 Shingles: no Pneumonia: no Hep C and HIV: HIV Hep C neg 2016 Labs: urine poct-, upt- Self breast exam: not done   reports that she has quit smoking. Her smoking use included Cigarettes. She smoked 0.10 packs per day for 0 years. She has never used smokeless tobacco. She reports that she drinks about 0.6 oz of alcohol per week. She reports that she does not use illicit drugs.  Past Medical History  Diagnosis Date  . HSV-1 (herpes simplex virus 1) infection   . Chlamydia contact, treated   . Trichimoniasis 11/09  . Gallstones   . ADHD (attention deficit hyperactivity disorder)   . Headache     Past Surgical History  Procedure Laterality Date  . Implanon      removed 12/07/11  . Wisdom tooth extraction    . Closed reduction nasal fracture    . Dilatation & curettage/hysteroscopy with myosure N/A 04/01/2015    Procedure: DILATATION & CURETTAGE/HYSTEROSCOPY with Suction;  Surgeon: Romualdo Bolk, MD;  Location: [facility];  Service: Gynecology;  Laterality: N/A;  . Hysteroscopy      Current Outpatient Prescriptions  Medication Sig Dispense Refill  . amphetamine-dextroamphetamine (ADDERALL XR) 20 MG  24 hr capsule Take 20 mg by mouth daily.    . meloxicam (MOBIC) 15 MG tablet Take 15 mg by mouth daily as needed for pain.    Marland Kitchen topiramate (TOPAMAX) 50 MG tablet Take 1 tablet (50 mg total) by mouth at bedtime. 10 tablet 1  . valACYclovir (VALTREX) 1000 MG tablet Take 1,000 mg by mouth 2 (two) times daily as needed (For cold sores.).     Marland Kitchen Vitamins A & D (VITAMIN A & D) ointment Apply 1 application topically as needed (Applied to tattoos.).     No current facility-administered medications for this visit.    Family History  Problem Relation Age of Onset  . Diabetes Maternal Grandmother     ROS:  Pertinent items are noted in HPI.  Otherwise, a comprehensive ROS was negative.  Exam:   BP 102/64 mmHg  Pulse 68  Resp 16  Ht 5' 6.5" (1.689 m)  Wt 232 lb (105.235 kg)  BMI 36.89 kg/m2  LMP 05/31/2015 Height: 5' 6.5" (168.9 cm) Ht Readings from Last 3 Encounters:  06/11/15 5' 6.5" (1.689 m)  05/12/15 5' 6.25" (1.683 m)  04/01/15  (1.676 m)    General appearance: alert, cooperative and appears stated age Head: Normocephalic, without obvious abnormality, atraumatic Neck: no adenopathy, supple, symmetrical, trachea midline and thyroid normal to inspection and palpation Lungs: clear to auscultation bilaterally Breasts: normal appearance, no masses or  tenderness, No nipple retraction or dimpling, No nipple discharge or bleeding, No axillary or supraclavicular adenopathy Heart: regular rate and rhythm Abdomen: soft, non-tender; no masses,  no organomegaly Umbilicus: slight increase pink around outside edge and some exudate noted in umbilicus. Shown area to patient and need to clean this area. Non tender Extremities: extremities normal, atraumatic, no cyanosis or edema Skin: Skin color, texture, turgor normal. No rashes or lesions Lymph nodes: Cervical, supraclavicular, and axillary nodes normal. No abnormal inguinal nodes palpated Neurologic: Grossly normal   Pelvic: External  genitalia:  no lesions              Urethra:  normal appearing urethra with no masses, tenderness or lesions              Bartholin's and Skene's: normal                 Vagina: normal appearing vagina with normal color and discharge, no lesions, no odor, affirm taken              Cervix: normal appearance, non tender, no lesions              Pap taken: No. Bimanual Exam:  Uterus:  normal size, contour, position, consistency, mobility, non-tender and mid position              Adnexa: normal adnexa and no mass, fullness, tenderness               Rectovaginal: Confirms               Anus:  normal appearance  Chaperone present: yes  A:  Well Woman with normal exam  Contraception condoms consistent  Slight inflamed appearance of umbilicus external skin, no infection signs noted  Weight gain  STD screening  ADHD/Migraine headache management with MD  P:   Reviewed health and wellness pertinent to exam  Discussed importance for prevention of pregnancy and STD's  Discussed cleaning area with soap and water and drying  well to prevent moisture accumulation. Patient will do this. Suggested Bacitracin OTC to external skin twice daily x 3 days to see if clears, if continues needs to follow with PCP. Warning signs of infection given. Patient voiced understanding.  Discussed importance of maintaining normal weight for height and reduction of other health issues. Discussed limiting fast food, sweets, and salty foods and watching portions. Discussed climbing steps at work several times daily for easy exercise. Patient will try.  Lab:Hep C,STD screen, GC,Chlamydia, Affirm  Continue follow up with MD as indicated.  Pap smear as above not taken    counseled on breast self exam, STD prevention, HIV risk factors and prevention, adequate intake of calcium and vitamin D, diet and exercise  return annually or prn  An After Visit Summary was printed and given to the patient.

## 2015-06-11 NOTE — Patient Instructions (Addendum)

## 2015-06-12 LAB — STD PANEL
HIV 1&2 Ab, 4th Generation: NONREACTIVE
Hepatitis B Surface Ag: NEGATIVE

## 2015-06-12 LAB — WET PREP BY MOLECULAR PROBE
CANDIDA SPECIES: NEGATIVE
Gardnerella vaginalis: NEGATIVE
TRICHOMONAS VAG: NEGATIVE

## 2015-06-12 LAB — HEPATITIS C ANTIBODY: HCV AB: NEGATIVE

## 2015-06-15 LAB — IPS N GONORRHOEA AND CHLAMYDIA BY PCR

## 2015-06-17 ENCOUNTER — Telehealth: Payer: Self-pay | Admitting: Certified Nurse Midwife

## 2015-06-17 NOTE — Telephone Encounter (Signed)
Pt says she knows she had an affirm test done at her appt & it was negative but one day ago patient states she started getting the same vaginal smell that she always has with a bacterial infection. Pt states it has not gone away. Pt says she is going out of town on Friday & doesn't want to leave & be on the trip & she is able to smell herself. Pt asking if you can send a rx for flagyl to her pharmacy. Please advise.

## 2015-06-17 NOTE — Telephone Encounter (Signed)
Patient has questions for the nurse. °

## 2015-06-18 ENCOUNTER — Other Ambulatory Visit: Payer: Self-pay

## 2015-06-18 MED ORDER — METRONIDAZOLE 500 MG PO TABS: 500.0000 mg | ORAL_TABLET | Freq: Two times a day (BID) | ORAL | Status: DC

## 2015-06-18 NOTE — Telephone Encounter (Signed)
Ok for this one time if not resolved will need OV.

## 2015-06-18 NOTE — Telephone Encounter (Signed)
rx sent to pharmacy pt notified

## 2015-06-19 NOTE — Progress Notes (Signed)
Encounter reviewed Jill Jertson, MD   

## 2015-09-22 ENCOUNTER — Ambulatory Visit (INDEPENDENT_AMBULATORY_CARE_PROVIDER_SITE_OTHER): Payer: BLUE CROSS/BLUE SHIELD | Admitting: Nurse Practitioner

## 2015-09-22 ENCOUNTER — Encounter: Payer: Self-pay | Admitting: Nurse Practitioner

## 2015-09-22 VITALS — BP 118/70 | HR 96 | Ht 66.5 in | Wt 235.0 lb

## 2015-09-22 DIAGNOSIS — N760 Acute vaginitis: Secondary | ICD-10-CM

## 2015-09-22 MED ORDER — METRONIDAZOLE 500 MG PO TABS: 500.0000 mg | ORAL_TABLET | Freq: Two times a day (BID) | ORAL | Status: DC

## 2015-09-22 NOTE — Progress Notes (Signed)
25 y.o. Single Caucasian female G0P0 here with complaint of vaginal symptoms of itching, burning, and increase discharge. Describes discharge as clear and with odor. Symptoms started before last cycle on 5/21. New change since January in partner. Onset of symptoms 7 days ago. Denies new personal products or vaginal dryness. STD concerns and will get at AEX.   Urinary symptoms none . Contraception is none for past 4 yrs.   O:  Healthy female WDWN Affect: normal, orientation x 3  Exam: no distress Abdomen: soft and non tender Lymph node: no enlargement or tenderness Pelvic exam: External genital: normal female BUS: negative Vagina: yellow and copious amount of discharge noted.  Affirm taken. Cervix: normal, non tender, no CMT   A: Vaginitis  History of recurrent BV  Need for AEX - will return to see DL   P: Discussed findings of vaginitis and etiology. She has changed types of under ware and not doing baths.  She has also stopped doing fragrant soaps.  She is currently doing laser hair removal.  Rx: Flagyl 500 mg BID for a week  Follow with Affirm  RV prn

## 2015-09-22 NOTE — Patient Instructions (Signed)

## 2015-09-23 LAB — WET PREP BY MOLECULAR PROBE
CANDIDA SPECIES: NEGATIVE
Gardnerella vaginalis: POSITIVE — AB
TRICHOMONAS VAG: NEGATIVE

## 2015-09-23 NOTE — Progress Notes (Signed)
Reviewed personally.  M. Suzanne Jerin Franzel, MD.  

## 2015-09-24 ENCOUNTER — Telehealth: Payer: Self-pay | Admitting: Nurse Practitioner

## 2015-09-24 NOTE — Telephone Encounter (Signed)
Patient is calling to see if her recent results are ready for her.

## 2015-09-24 NOTE — Telephone Encounter (Signed)
Patient should just treat for Bv and start on oral probiotic Florajen to help prevent yeast development. SHe can by this probiotic without Rx but will need to go to pharmacy counter to ask for due to refrigerated.

## 2015-09-24 NOTE — Telephone Encounter (Signed)
Patient calling to check affirm results. Advised bacterial vaginosis positive, negative for yeast and trichomoniasis.  Patient asking if Diflucan can be sent in for her in case she starts having vaginal itching.  She is not currently having any symptoms but has occurred for her in the past.  Advised will sent her request to Ria CommentPatricia Grubb, FNP to review.

## 2015-09-28 ENCOUNTER — Telehealth: Payer: Self-pay | Admitting: Nurse Practitioner

## 2015-09-28 NOTE — Telephone Encounter (Signed)
Patient was seen in the ER over the weekend and was told that she has a cyst and PID. Patient was told to follow up with her GYN.

## 2015-09-28 NOTE — Telephone Encounter (Signed)
Spoke with French Anaracy at [facility] who is faxing records to our office at this time for MD review.

## 2015-09-28 NOTE — Telephone Encounter (Signed)
Patient called to check on the status of the records. She is now aware we are still waiting for them and Nilda SimmerKailtyn will call the hospital to check on the records.

## 2015-09-28 NOTE — Telephone Encounter (Signed)
Patient has called to office for follow up ER Visit  09/28/2015. Will close encounter.

## 2015-09-28 NOTE — Telephone Encounter (Signed)
Records received and reviewed with Ria CommentPatricia Grubb, FNP and Dr.Jertson. Per Dr.Jertson patient needs to start Doxycycline today and take Norco as needed for pain. Patient will need to be seen for follow up appointment in 2 days with Dr.Jertson. Patient is agreeable to recommendations. Appointment scheduled for 09/30/2015 at 11:30 am with Dr.Jertson. She is agreeable to date and time. Aware if her symptoms worsen or she develops new symptoms such as fever or chills will need to be seen in the office earlier or seen with local ED. She is agreeable.  Dr.Jertson, do you agree with recommendations given?

## 2015-09-28 NOTE — Telephone Encounter (Signed)
Spoke with Cordelia PenSherry at [facility] who states records release will need to be re-faxed as she cannot locate it. Records release request faxed to Sherry's attention with cover sheet and confirmation to [phone]. Fax number verified with Cordelia PenSherry.

## 2015-09-28 NOTE — Telephone Encounter (Signed)
Spoke with patient. Patient states that she was seen at [facility] in [place] yesterday for pelvic pain. Reports the pain started on 09/23/2015, but started to worsen yesterday. Denies any fever or chills. Reports she had a CT scan and a PUS while at the hospital. Was told she has bilateral ovarian cysts, PID, and a STD. "I do not know how they know I have an STD when my results are not back." Was given prescriptions for Norco and Doxycycline. Patient has not started taking either rx. "I do not want to start taking the medication until I spoke with your office. I trust your office more." Reports her pelvic pain is a constant 7/10. She is currently being treated for BV by our office with Flagyl (please see OV from 09/22/2015). Patient is currently in [place], [place] for work but will be traveling back today.Patient is experiencing nausea with no vomiting. Advised I will [facility] for records to be reviewed by MD in our office. Will return call with further follow up recommendations. She is agreeable.  Spoke with Cordelia PenSherry at [facility] who states records request will need to be faxed to [phone]. Records request faxed with confirmation.

## 2015-09-29 ENCOUNTER — Ambulatory Visit: Payer: BLUE CROSS/BLUE SHIELD | Admitting: Obstetrics and Gynecology

## 2015-09-29 ENCOUNTER — Encounter: Payer: Self-pay | Admitting: Obstetrics and Gynecology

## 2015-09-29 ENCOUNTER — Ambulatory Visit (INDEPENDENT_AMBULATORY_CARE_PROVIDER_SITE_OTHER): Payer: BLUE CROSS/BLUE SHIELD | Admitting: Obstetrics and Gynecology

## 2015-09-29 VITALS — BP 138/84 | HR 80 | Resp 15 | Wt 240.0 lb

## 2015-09-29 DIAGNOSIS — R102 Pelvic and perineal pain: Secondary | ICD-10-CM | POA: Diagnosis not present

## 2015-09-29 DIAGNOSIS — R195 Other fecal abnormalities: Secondary | ICD-10-CM

## 2015-09-29 DIAGNOSIS — R1031 Right lower quadrant pain: Secondary | ICD-10-CM | POA: Diagnosis not present

## 2015-09-29 DIAGNOSIS — D72829 Elevated white blood cell count, unspecified: Secondary | ICD-10-CM | POA: Diagnosis not present

## 2015-09-29 DIAGNOSIS — N83201 Unspecified ovarian cyst, right side: Secondary | ICD-10-CM

## 2015-09-29 LAB — CBC WITH DIFFERENTIAL/PLATELET
BASOS ABS: 81 {cells}/uL (ref 0–200)
Basophils Relative: 1 %
EOS ABS: 243 {cells}/uL (ref 15–500)
EOS PCT: 3 %
HCT: 38.7 % (ref 35.0–45.0)
HEMOGLOBIN: 13.1 g/dL (ref 11.7–15.5)
Lymphocytes Relative: 36 %
Lymphs Abs: 2916 cells/uL (ref 850–3900)
MCH: 30.5 pg (ref 27.0–33.0)
MCHC: 33.9 g/dL (ref 32.0–36.0)
MCV: 90.2 fL (ref 80.0–100.0)
MONOS PCT: 6 %
MPV: 9.4 fL (ref 7.5–12.5)
Monocytes Absolute: 486 cells/uL (ref 200–950)
NEUTROS ABS: 4374 {cells}/uL (ref 1500–7800)
NEUTROS PCT: 54 %
PLATELETS: 376 10*3/uL (ref 140–400)
RBC: 4.29 MIL/uL (ref 3.80–5.10)
RDW: 12.7 % (ref 11.0–15.0)
WBC: 8.1 10*3/uL (ref 3.8–10.8)

## 2015-09-29 LAB — POCT URINALYSIS DIPSTICK
BILIRUBIN UA: NEGATIVE
Glucose, UA: NEGATIVE
KETONES UA: NEGATIVE
Nitrite, UA: NEGATIVE
PH UA: 6.5
PROTEIN UA: NEGATIVE
Urobilinogen, UA: NEGATIVE

## 2015-09-29 LAB — POCT URINE PREGNANCY: Preg Test, Ur: NEGATIVE

## 2015-09-29 NOTE — Progress Notes (Signed)
Patient ID: Pamela Weber, female   DOB: [birth year], 25 y.o.   MRN: [id number] GYNECOLOGY  VISIT   HPI: 25 y.o.   Single  Caucasian  female   G0P0 with Patient's last menstrual period was 09/13/2015.   here for a 6 day h/o pelvic pain. The patient was seen in the ER 2 days ago, treated for PID. She was given Ceftriaxone in the ER and a script for doxycycline that she didn't start until yesterday. At the time of her visit in the ER her WBC was 14.9, urine looked concerning for a UTI (no urine culture seen), genprobe returned as negative. UPT negative. U/S with a 3.7 cm simple right ovarian cyst. CT scan with "mild inflammatory changes", could represent PID or be a normal physiologic change.  She c/o continued pain, slightly improved from 2 days ago. The pain is mainly in the RLQ, constant, up to a 7.5/10 in severity, worse with walking or moving. The constant pain is achy. She is having intermittent severe sharp pains that shoot across her mid abdomen. Lasts for a couple of seconds "feels like I am struck by lightening". No fevers. Nausea without vomiting. Some diarrhea, 1-2 loose stools a day. No urinary frequency, pain or urgency.  She has had a new sexual partner (not sexually active since her last visit here). Not using condoms, okay if she gets pregnant. On 5/30 wet prep probe + for BV, she is still finishing her antibiotics for that.  GYNECOLOGIC HISTORY: Patient's last menstrual period was 09/13/2015. Contraception:condoms Menopausal hormone therapy: none        OB History    Gravida Para Term Preterm AB TAB SAB Ectopic Multiple Living   0 0                 There are no active problems to display for this patient.   Past Medical History  Diagnosis Date  . HSV-1 (herpes simplex virus 1) infection   . Chlamydia contact, treated   . Trichimoniasis 11/09  . Gallstones   . ADHD (attention deficit hyperactivity disorder)   . Headache   . PID (acute pelvic inflammatory disease)      Past Surgical History  Procedure Laterality Date  . Implanon      removed 12/07/11  . Wisdom tooth extraction    . Closed reduction nasal fracture    . Dilatation & curettage/hysteroscopy with myosure N/A 04/01/2015    Procedure: DILATATION & CURETTAGE/HYSTEROSCOPY with Suction;  Surgeon: Romualdo Bolk, MD;  Location: [facility];  Service: Gynecology;  Laterality: N/A;  . Hysteroscopy      Current Outpatient Prescriptions  Medication Sig Dispense Refill  . amphetamine-dextroamphetamine (ADDERALL XR) 20 MG 24 hr capsule Take 20 mg by mouth daily.    Marland Kitchen doxycycline (VIBRA-TABS) 100 MG tablet   0  . HYDROcodone-acetaminophen (NORCO/VICODIN) 5-325 MG tablet   0  . meloxicam (MOBIC) 15 MG tablet Take 15 mg by mouth daily as needed for pain.    . metroNIDAZOLE (FLAGYL) 500 MG tablet Take 1 tablet (500 mg total) by mouth 2 (two) times daily. 14 tablet 0  . ondansetron (ZOFRAN-ODT) 4 MG disintegrating tablet   0  . topiramate (TOPAMAX) 50 MG tablet Take 1 tablet (50 mg total) by mouth at bedtime. 10 tablet 1  . valACYclovir (VALTREX) 1000 MG tablet Take 1,000 mg by mouth 2 (two) times daily as needed (For cold sores.).     Marland Kitchen Vitamins A & D (VITAMIN A &  D) ointment Apply 1 application topically as needed (Applied to tattoos.).     No current facility-administered medications for this visit.     ALLERGIES: Review of patient's allergies indicates no known allergies.  Family History  Problem Relation Age of Onset  . Diabetes Maternal Grandmother     Social History   Social History  . Marital Status: Single    Spouse Name: N/A  . Number of Children: N/A  . Years of Education: N/A   Occupational History  . Not on file.   Social History Main Topics  . Smoking status: Former Smoker -- 0.10 packs/day for 0 years    Types: Cigarettes  . Smokeless tobacco: Never Used  . Alcohol Use: 0.6 oz/week    1 Standard drinks or equivalent per week  . Drug Use: No  . Sexual Activity:     Partners: Male    Birth Control/ Protection: Condom   Other Topics Concern  . Not on file   Social History Narrative    Review of Systems  Constitutional: Negative.   HENT: Negative.   Eyes: Negative.   Respiratory: Negative.   Cardiovascular: Negative.   Gastrointestinal: Positive for nausea and diarrhea.       Bloating  Genitourinary: Positive for hematuria.       Pelvic pain  Musculoskeletal: Negative.   Skin: Negative.   Neurological: Negative.   Endo/Heme/Allergies: Negative.   Psychiatric/Behavioral: Negative.     PHYSICAL EXAMINATION:    BP 138/84 mmHg  Pulse 80  Resp 15  Wt 240 lb (108.863 kg)  LMP 09/13/2015    General appearance: alert, cooperative and appears stated age Abdomen: soft, mildly tender BLQ, no rebound, no guarding, bowel sounds normal; no masses,  no organomegaly  Pelvic: External genitalia:  no lesions              Urethra:  normal appearing urethra with no masses, tenderness or lesions              Bartholins and Skenes: normal                 Vagina: normal appearing vagina with normal color and discharge, no lesions, + blood in her vagina (mild to moderate amount)              Cervix: no lesions, no CMT              Bimanual Exam:  Uterus:  normal size, contour, position, consistency, mobility, non-tender and anteverted              Adnexa: no masses, bilateral adnexal tenderness, only with abdominal hand              Rectovaginal: only tender with palpation of the right adnexa              Anus:  normal sphincter tone, no lesions  Chaperone was present for exam.  ER records reviewed  ASSESSMENT Abdominal/pelvic pain.  Possible PID, s/p antibiotics in the hospital, just started doxycycline (only one dose). Current exam not c/w PID, may be partially treated 3.7 cm right ovarian cyst On medications for BV Urine dip concerning for UTI, no urinary c/o Abnormal uterine bleeding, currently having mid cycle bleeding    PLAN Send urine  for ua, c&s UPT negative Recommended she continue her doxycycline and flagyl Check CBC with diff F/U ultrasound in 6 weeks Keep track of bleeding F/U next week, sooner if she has worsening pain, fevers, or any  other concerns   An After Visit Summary was printed and given to the patient.

## 2015-09-29 NOTE — Telephone Encounter (Signed)
Spoke with patient at time of incoming call. Patient states she started bleeding last night "like my normal period." Reports LMP was 2 weeks ago. Patient is very concerned. Reports she started her Doxycycline last night. Took 1 Norco before bed and was able to sleep through the night. Reports she is still experiencing pelvic pain this morning. Pain is 6-7/10. Has not taken any medication this morning for pain. Advised she may take 600-800 mg of Ibuprofen/Motrin for discomfort as she is at work. Advised she will need to be seen in the office for further evaluation. She is agreeable. Denies early morning appointment due to her schedule. Appointment scheduled for today at 2:15 pm with Dr.Jertson. She is agreeable to date and time.  Routing to provider for final review. Patient agreeable to disposition. Will close encounter.

## 2015-09-30 ENCOUNTER — Telehealth: Payer: Self-pay | Admitting: Obstetrics and Gynecology

## 2015-09-30 ENCOUNTER — Ambulatory Visit: Payer: BLUE CROSS/BLUE SHIELD | Admitting: Obstetrics and Gynecology

## 2015-09-30 LAB — URINALYSIS, MICROSCOPIC ONLY
Casts: NONE SEEN [LPF]
Yeast: NONE SEEN [HPF]

## 2015-09-30 NOTE — Telephone Encounter (Signed)
Called patient to review benefits for a recommended procedure. Left Voicemail requesting a call back. °

## 2015-10-01 ENCOUNTER — Telehealth: Payer: Self-pay | Admitting: *Deleted

## 2015-10-01 LAB — URINE CULTURE
Colony Count: NO GROWTH
Organism ID, Bacteria: NO GROWTH

## 2015-10-01 NOTE — Telephone Encounter (Signed)
Patient returned call

## 2015-10-01 NOTE — Telephone Encounter (Signed)
Left message to call -eh 

## 2015-10-01 NOTE — Telephone Encounter (Signed)
-----   Message from Romualdo BolkJill Evelyn Jertson, MD sent at 09/30/2015  5:41 PM EDT ----- Please inform the patient that her WBC is now normal. Urine dip concerning for infection, culture still pending. Please see if she is feeling any better.

## 2015-10-01 NOTE — Telephone Encounter (Signed)
Left another message to call -eh 

## 2015-10-02 NOTE — Telephone Encounter (Signed)
Patient is returning a call to Elaine. °

## 2015-10-02 NOTE — Telephone Encounter (Signed)
Spoke with patient and gave lab results. She said that pain is better but she is still bleeding and it's not slowed down any. She is asking how much longer she should bleed-eh

## 2015-10-06 ENCOUNTER — Ambulatory Visit (INDEPENDENT_AMBULATORY_CARE_PROVIDER_SITE_OTHER): Payer: BLUE CROSS/BLUE SHIELD | Admitting: Nurse Practitioner

## 2015-10-06 ENCOUNTER — Encounter: Payer: Self-pay | Admitting: Nurse Practitioner

## 2015-10-06 VITALS — BP 120/60 | HR 80 | Resp 18 | Ht 66.5 in | Wt 230.0 lb

## 2015-10-06 DIAGNOSIS — Z0000 Encounter for general adult medical examination without abnormal findings: Secondary | ICD-10-CM | POA: Diagnosis not present

## 2015-10-06 DIAGNOSIS — Z113 Encounter for screening for infections with a predominantly sexual mode of transmission: Secondary | ICD-10-CM

## 2015-10-06 DIAGNOSIS — Z202 Contact with and (suspected) exposure to infections with a predominantly sexual mode of transmission: Secondary | ICD-10-CM

## 2015-10-06 DIAGNOSIS — Z01419 Encounter for gynecological examination (general) (routine) without abnormal findings: Secondary | ICD-10-CM

## 2015-10-06 LAB — POCT URINALYSIS DIPSTICK
Bilirubin, UA: NEGATIVE
GLUCOSE UA: NEGATIVE
Ketones, UA: NEGATIVE
NITRITE UA: NEGATIVE
Protein, UA: NEGATIVE
UROBILINOGEN UA: NEGATIVE
pH, UA: 5

## 2015-10-06 LAB — POCT URINE PREGNANCY: PREG TEST UR: NEGATIVE

## 2015-10-06 MED ORDER — FLUCONAZOLE 150 MG PO TABS: 150.0000 mg | ORAL_TABLET | Freq: Once | ORAL | Status: DC

## 2015-10-06 NOTE — Patient Instructions (Signed)
General topics  Next pap or exam is  due in 1 year Take a Women's multivitamin Take 1200 mg. of calcium daily - prefer dietary If any concerns in interim to call back  Breast Self-Awareness Practicing breast self-awareness may pick up problems early, prevent significant medical complications, and possibly save your life. By practicing breast self-awareness, you can become familiar with how your breasts look and feel and if your breasts are changing. This allows you to notice changes early. It can also offer you some reassurance that your breast health is good. One way to learn what is normal for your breasts and whether your breasts are changing is to do a breast self-exam. If you find a lump or something that was not present in the past, it is best to contact your caregiver right away. Other findings that should be evaluated by your caregiver include nipple discharge, especially if it is bloody; skin changes or reddening; areas where the skin seems to be pulled in (retracted); or new lumps and bumps. Breast pain is seldom associated with cancer (malignancy), but should also be evaluated by a caregiver. BREAST SELF-EXAM The best time to examine your breasts is 5 7 days after your menstrual period is over.  ExitCare Patient Information 2013 [employer].   Exercise to Stay Healthy Exercise helps you become and stay healthy. EXERCISE IDEAS AND TIPS Choose exercises that:  You enjoy.  Fit into your day. You do not need to exercise really hard to be healthy. You can do exercises at a slow or medium level and stay healthy. You can:  Stretch before and after working out.  Try yoga, Pilates, or tai chi.  Lift weights.  Walk fast, swim, jog, run, climb stairs, bicycle, dance, or rollerskate.  Take aerobic classes. Exercises that burn about 150 calories:  Running 1  miles in 15 minutes.  Playing volleyball for 45 to 60 minutes.  Washing and waxing a car for 45 to 60  minutes.  Playing touch football for 45 minutes.  Walking 1  miles in 35 minutes.  Pushing a stroller 1  miles in 30 minutes.  Playing basketball for 30 minutes.  Raking leaves for 30 minutes.  Bicycling 5 miles in 30 minutes.  Walking 2 miles in 30 minutes.  Dancing for 30 minutes.  Shoveling Fletes for 15 minutes.  Swimming laps for 20 minutes.  Walking up stairs for 15 minutes.  Bicycling 4 miles in 15 minutes.  Gardening for 30 to 45 minutes.  Jumping rope for 15 minutes.  Washing windows or floors for 45 to 60 minutes. Document Released: 05/14/2010 Document Revised: 07/04/2011 Document Reviewed: 05/14/2010 ExitCare Patient Information 2013 [employer].   Other topics ( that may be useful information):    Sexually Transmitted Disease Sexually transmitted disease (STD) refers to any infection that is passed from person to person during sexual activity. This may happen by way of saliva, semen, blood, vaginal mucus, or urine. Common STDs include:  Gonorrhea.  Chlamydia.  Syphilis.  HIV/AIDS.  Genital herpes.  Hepatitis B and C.  Trichomonas.  Human papillomavirus (HPV).  Pubic lice. CAUSES  An STD may be spread by bacteria, virus, or parasite. A person can get an STD by:  Sexual intercourse with an infected person.  Sharing sex toys with an infected person.  Sharing needles with an infected person.  Having intimate contact with the genitals, mouth, or rectal areas of an infected person. SYMPTOMS  Some people may not have any symptoms, but   they can still pass the infection to others. Different STDs have different symptoms. Symptoms include:  Painful or bloody urination.  Pain in the pelvis, abdomen, vagina, anus, throat, or eyes.  Skin rash, itching, irritation, growths, or sores (lesions). These usually occur in the genital or anal area.  Abnormal vaginal discharge.  Penile discharge in men.  Soft, flesh-colored skin growths in the  genital or anal area.  Fever.  Pain or bleeding during sexual intercourse.  Swollen glands in the groin area.  Yellow skin and eyes (jaundice). This is seen with hepatitis. DIAGNOSIS  To make a diagnosis, your caregiver may:  Take a medical history.  Perform a physical exam.  Take a specimen (culture) to be examined.  Examine a sample of discharge under a microscope.  Perform blood test TREATMENT   Chlamydia, gonorrhea, trichomonas, and syphilis can be cured with antibiotic medicine.  Genital herpes, hepatitis, and HIV can be treated, but not cured, with prescribed medicines. The medicines will lessen the symptoms.  Genital warts from HPV can be treated with medicine or by freezing, burning (electrocautery), or surgery. Warts may come back.  HPV is a virus and cannot be cured with medicine or surgery.However, abnormal areas may be followed very closely by your caregiver and may be removed from the cervix, vagina, or vulva through office procedures or surgery. If your diagnosis is confirmed, your recent sexual partners need treatment. This is true even if they are symptom-free or have a negative culture or evaluation. They should not have sex until their caregiver says it is okay. HOME CARE INSTRUCTIONS  All sexual partners should be informed, tested, and treated for all STDs.  Take your antibiotics as directed. Finish them even if you start to feel better.  Only take over-the-counter or prescription medicines for pain, discomfort, or fever as directed by your caregiver.  Rest.  Eat a balanced diet and drink enough fluids to keep your urine clear or pale yellow.  Do not have sex until treatment is completed and you have followed up with your caregiver. STDs should be checked after treatment.  Keep all follow-up appointments, Pap tests, and blood tests as directed by your caregiver.  Only use latex condoms and water-soluble lubricants during sexual activity. Do not use  petroleum jelly or oils.  Avoid alcohol and illegal drugs.  Get vaccinated for HPV and hepatitis. If you have not received these vaccines in the past, talk to your caregiver about whether one or both might be right for you.  Avoid risky sex practices that can break the skin. The only way to avoid getting an STD is to avoid all sexual activity.Latex condoms and dental dams (for oral sex) will help lessen the risk of getting an STD, but will not completely eliminate the risk. SEEK MEDICAL CARE IF:   You have a fever.  You have any new or worsening symptoms. Document Released: 07/02/2002 Document Revised: 07/04/2011 Document Reviewed: 07/09/2010 [facility] Patient Information 2013 [employer].    Domestic Abuse You are being battered or abused if someone close to you hits, pushes, or physically hurts you in any way. You also are being abused if you are forced into activities. You are being sexually abused if you are forced to have sexual contact of any kind. You are being emotionally abused if you are made to feel worthless or if you are constantly threatened. It is important to remember that help is available. No one has the right to abuse you. PREVENTION OF FURTHER  ABUSE  Learn the warning signs of danger. This varies with situations but may include: the use of alcohol, threats, isolation from friends and family, or forced sexual contact. Leave if you feel that violence is going to occur.  If you are attacked or beaten, report it to the police so the abuse is documented. You do not have to press charges. The police can protect you while you or the attackers are leaving. Get the officer's name and badge number and a copy of the report.  Find someone you can trust and tell them what is happening to you: your caregiver, a nurse, clergy member, close friend or family member. Feeling ashamed is natural, but remember that you have done nothing wrong. No one deserves abuse. Document Released:  04/08/2000 Document Revised: 07/04/2011 Document Reviewed: 06/17/2010 ExitCare Patient Information 2013 [employer].    How Much is Too Much Alcohol? Drinking too much alcohol can cause injury, accidents, and health problems. These types of problems can include:   Car crashes.  Falls.  Family fighting (domestic violence).  Drowning.  Fights.  Injuries.  Burns.  Damage to certain organs.  Having a baby with birth defects. ONE DRINK CAN BE TOO MUCH WHEN YOU ARE:  Working.  Pregnant or breastfeeding.  Taking medicines. Ask your doctor.  Driving or planning to drive. If you or someone you know has a drinking problem, get help from a doctor.  Document Released: 02/05/2009 Document Revised: 07/04/2011 Document Reviewed: 02/05/2009 ExitCare Patient Information 2013 [employer].   Smoking Hazards Smoking cigarettes is extremely bad for your health. Tobacco smoke has over 200 known poisons in it. There are over 60 chemicals in tobacco smoke that cause cancer. Some of the chemicals found in cigarette smoke include:   Cyanide.  Benzene.  Formaldehyde.  Methanol (wood alcohol).  Acetylene (fuel used in welding torches).  Ammonia. Cigarette smoke also contains the poisonous gases nitrogen oxide and carbon monoxide.  Cigarette smokers have an increased risk of many serious medical problems and Smoking causes approximately:  90% of all lung cancer deaths in men.  80% of all lung cancer deaths in women.  90% of deaths from chronic obstructive lung disease. Compared with nonsmokers, smoking increases the risk of:  Coronary heart disease by 2 to 4 times.  Stroke by 2 to 4 times.  Men developing lung cancer by 23 times.  Women developing lung cancer by 13 times.  Dying from chronic obstructive lung diseases by 12 times.  . Smoking is the most preventable cause of death and disease in our society.  WHY IS SMOKING ADDICTIVE?  Nicotine is the chemical  agent in tobacco that is capable of causing addiction or dependence.  When you smoke and inhale, nicotine is absorbed rapidly into the bloodstream through your lungs. Nicotine absorbed through the lungs is capable of creating a powerful addiction. Both inhaled and non-inhaled nicotine may be addictive.  Addiction studies of cigarettes and spit tobacco show that addiction to nicotine occurs mainly during the teen years, when young people begin using tobacco products. WHAT ARE THE BENEFITS OF QUITTING?  There are many health benefits to quitting smoking.   Likelihood of developing cancer and heart disease decreases. Health improvements are seen almost immediately.  Blood pressure, pulse rate, and breathing patterns start returning to normal soon after quitting. QUITTING SMOKING   American Lung Association - 1-800-LUNGUSA  American Cancer Society - 1-800-ACS-2345 Document Released: 05/19/2004 Document Revised: 07/04/2011 Document Reviewed: 01/21/2009 ExitCare Patient Information 2013 ExitCare,   LLC.   Stress Management Stress is a state of physical or mental tension that often results from changes in your life or normal routine. Some common causes of stress are:  Death of a loved one.  Injuries or severe illnesses.  Getting fired or changing jobs.  Moving into a new home. Other causes may be:  Sexual problems.  Business or financial losses.  Taking on a large debt.  Regular conflict with someone at home or at work.  Constant tiredness from lack of sleep. It is not just bad things that are stressful. It may be stressful to:  Win the lottery.  Get married.  Buy a new car. The amount of stress that can be easily tolerated varies from person to person. Changes generally cause stress, regardless of the types of change. Too much stress can affect your health. It may lead to physical or emotional problems. Too little stress (boredom) may also become stressful. SUGGESTIONS TO  REDUCE STRESS:  Talk things over with your family and friends. It often is helpful to share your concerns and worries. If you feel your problem is serious, you may want to get help from a professional counselor.  Consider your problems one at a time instead of lumping them all together. Trying to take care of everything at once may seem impossible. List all the things you need to do and then start with the most important one. Set a goal to accomplish 2 or 3 things each day. If you expect to do too many in a single day you will naturally fail, causing you to feel even more stressed.  Do not use alcohol or drugs to relieve stress. Although you may feel better for a short time, they do not remove the problems that caused the stress. They can also be habit forming.  Exercise regularly - at least 3 times per week. Physical exercise can help to relieve that "uptight" feeling and will relax you.  The shortest distance between despair and hope is often a good night's sleep.  Go to bed and get up on time allowing yourself time for appointments without being rushed.  Take a short "time-out" period from any stressful situation that occurs during the day. Close your eyes and take some deep breaths. Starting with the muscles in your face, tense them, hold it for a few seconds, then relax. Repeat this with the muscles in your neck, shoulders, hand, stomach, back and legs.  Take good care of yourself. Eat a balanced diet and get plenty of rest.  Schedule time for having fun. Take a break from your daily routine to relax. HOME CARE INSTRUCTIONS   Call if you feel overwhelmed by your problems and feel you can no longer manage them on your own.  Return immediately if you feel like hurting yourself or someone else. Document Released: 10/05/2000 Document Revised: 07/04/2011 Document Reviewed: 05/28/2007 ExitCare Patient Information 2013 ExitCare, [employer].  

## 2015-10-06 NOTE — Progress Notes (Signed)
25 y.o. G0P0 Single  Caucasian Fe here for annual exam.  (prior AEX 05/2015 but has new insurance).  Recent visit to ED for PID.  She was treated with Flagyl, Doxycycline and injection of Rocephin.  Urine C&S negative.  STD panel was negative.  Since then has been SA with same partner, yesterday found out he has been unfaithful. Menses are regular lasting 5-6 days.  Cramps for 2-3 days some help with OTC NSAID's.  Some PMS.  Same partner since 04/2015.  Ended previous last relationship 10/2014.  She claims that in between partners she has another person whom she is SA with who also has a girlfriend.  Patient's last menstrual period was 09/13/2015.          Sexually active: Yes.    The current method of family planning is none.    Exercising: No.  The patient does not participate in regular exercise at present. Smoker:  no  Health Maintenance: Pap:  05/22/14 Neg TDaP:  2008 Gardasil: 2008  HIV: 06/11/15 Neg Labs: Here UA: WBC=Trace, RBC=Trace UPT: Neg   reports that she has quit smoking. Her smoking use included Cigarettes. She smoked 0.10 packs per day for 0 years. She has never used smokeless tobacco. She reports that she drinks about 0.6 oz of alcohol per week. She reports that she does not use illicit drugs.  Past Medical History  Diagnosis Date  . HSV-1 (herpes simplex virus 1) infection   . Chlamydia contact, treated   . Trichimoniasis 11/09  . Gallstones   . ADHD (attention deficit hyperactivity disorder)   . Headache   . PID (acute pelvic inflammatory disease)     Past Surgical History  Procedure Laterality Date  . Implanon      removed 12/07/11  . Wisdom tooth extraction    . Closed reduction nasal fracture    . Dilatation & curettage/hysteroscopy with myosure N/A 04/01/2015    Procedure: DILATATION & CURETTAGE/HYSTEROSCOPY with Suction;  Surgeon: Romualdo Bolk, MD;  Location: [facility];  Service: Gynecology;  Laterality: N/A;  . Hysteroscopy      Current Outpatient  Prescriptions  Medication Sig Dispense Refill  . amphetamine-dextroamphetamine (ADDERALL XR) 20 MG 24 hr capsule Take 20 mg by mouth daily.    Marland Kitchen HYDROcodone-acetaminophen (NORCO/VICODIN) 5-325 MG tablet   0  . Vitamins A & D (VITAMIN A & D) ointment Apply 1 application topically as needed (Applied to tattoos.).    Marland Kitchen fluconazole (DIFLUCAN) 150 MG tablet Take 1 tablet (150 mg total) by mouth once. Take one tablet.  Repeat in 48 hours if symptoms are not completely resolved. 2 tablet 1  . meloxicam (MOBIC) 15 MG tablet Take 15 mg by mouth daily as needed for pain. Reported on 10/06/2015    . ondansetron (ZOFRAN-ODT) 4 MG disintegrating tablet Reported on 10/06/2015  0  . topiramate (TOPAMAX) 50 MG tablet Take 1 tablet (50 mg total) by mouth at bedtime. (Patient not taking: Reported on 10/06/2015) 10 tablet 1  . valACYclovir (VALTREX) 1000 MG tablet Take 1,000 mg by mouth 2 (two) times daily as needed (For cold sores.). Reported on 10/06/2015     No current facility-administered medications for this visit.    Family History  Problem Relation Age of Onset  . Diabetes Maternal Grandmother   . Lymphoma Paternal Grandmother   . Lung cancer Paternal Grandfather   . Kidney disease Mother     ROS:  Pertinent items are noted in HPI.  Otherwise, a comprehensive  ROS was negative.  Exam:   BP 120/60 mmHg  Pulse 80  Resp 18  Ht 5' 6.5" (1.689 m)  Wt 230 lb (104.327 kg)  BMI 36.57 kg/m2  LMP 09/13/2015 Height: 5' 6.5" (168.9 cm) Ht Readings from Last 3 Encounters:  10/06/15 5' 6.5" (1.689 m)  09/22/15 5' 6.5" (1.689 m)  06/11/15 5' 6.5" (1.689 m)    General appearance: alert, cooperative and appears stated age Head: Normocephalic, without obvious abnormality, atraumatic Neck: no adenopathy, supple, symmetrical, trachea midline and thyroid normal to inspection and palpation Lungs: clear to auscultation bilaterally Breasts: normal appearance, no masses or tenderness Heart: regular rate and  rhythm Abdomen: soft, non-tender; no masses,  no organomegaly Extremities: extremities normal, atraumatic, no cyanosis or edema Skin: Skin color, texture, turgor normal. No rashes or lesions Lymph nodes: Cervical, supraclavicular, and axillary nodes normal. No abnormal inguinal nodes palpated Neurologic: Grossly normal   Pelvic: External genitalia:  Extensive area of yeast of the labia to the rectum.  Skin is scaling, rough and red.              Urethra:  normal appearing urethra with no masses, tenderness or lesions              Bartholin's and Skene's: normal                 Vagina: normal appearing vagina with normal color and discharge, no lesions              Cervix: anteverted              Pap taken: Yes.   Bimanual Exam:  Uterus:  normal size, contour, position, consistency, mobility, non-tender              Adnexa: no mass, fullness, tenderness               Rectovaginal: Confirms               Anus:  normal sphincter tone, no lesions  Chaperone present: yes  A:  Well Woman with normal exam  Contraception condoms consistent  Recent ED treatment for PID   External yeast vaginitis STD screening ADHD/Migraine headache management with MD    P:   Reviewed health and wellness pertinent to exam  Pap smear as above  Follow with STD's and pap  Diflucan 150 mg X 2 / 1 given to treat yeast  Counseled on breast self exam, STD prevention, HIV risk factors and prevention, adequate intake of calcium and vitamin D, diet and exercise return annually or prn  An After Visit Summary was printed and given to the patient.

## 2015-10-07 LAB — WET PREP BY MOLECULAR PROBE
CANDIDA SPECIES: NEGATIVE
GARDNERELLA VAGINALIS: NEGATIVE
TRICHOMONAS VAG: NEGATIVE

## 2015-10-07 LAB — STD PANEL
HEP B S AG: NEGATIVE
HIV: NONREACTIVE

## 2015-10-08 LAB — HSV(HERPES SMPLX)ABS-I+II(IGG+IGM)-BLD
HSV 1 GLYCOPROTEIN G AB, IGG: 27.5 {index} — AB (ref <0.90)
HSV 2 Glycoprotein G Ab, IgG: <0.9 Index (ref <0.90)
Herpes Simplex Vrs I&II-IgM Ab (EIA): 2.87 INDEX — ABNORMAL HIGH

## 2015-10-08 LAB — IPS PAP TEST WITH REFLEX TO HPV

## 2015-10-08 LAB — IPS N GONORRHOEA AND CHLAMYDIA BY PCR

## 2015-10-09 NOTE — Progress Notes (Signed)
Encounter reviewed by Dr. Tylik Treese Amundson C. Silva.  

## 2015-11-09 ENCOUNTER — Telehealth: Payer: Self-pay

## 2015-11-09 DIAGNOSIS — Z8742 Personal history of other diseases of the female genital tract: Secondary | ICD-10-CM

## 2015-11-09 DIAGNOSIS — N83201 Unspecified ovarian cyst, right side: Secondary | ICD-10-CM

## 2015-11-09 NOTE — Telephone Encounter (Signed)
-----   Message from Mertha BaarsSuzy R Dixon sent at 11/09/2015 11:06 AM EDT ----- Regarding: follow up ultrasound Good Morning Kaitlyn,   This patient is scheduled to for a follow up ultrasound tomorrow with Dr Oscar LaJertson, but the order has expired.  Do you mind placing a new order?  Thank you,  Thomasene LotSuzy

## 2015-11-09 NOTE — Telephone Encounter (Signed)
Order for PUS placed and linked to the patient's appointment on 11/10/2015.  Cc: Harland DingwallSuzy Dixon  Routing to provider for final review. Patient agreeable to disposition. Will close encounter.

## 2015-11-10 ENCOUNTER — Ambulatory Visit (INDEPENDENT_AMBULATORY_CARE_PROVIDER_SITE_OTHER): Payer: BLUE CROSS/BLUE SHIELD

## 2015-11-10 ENCOUNTER — Encounter: Payer: Self-pay | Admitting: Obstetrics and Gynecology

## 2015-11-10 ENCOUNTER — Ambulatory Visit (INDEPENDENT_AMBULATORY_CARE_PROVIDER_SITE_OTHER): Payer: BLUE CROSS/BLUE SHIELD | Admitting: Obstetrics and Gynecology

## 2015-11-10 VITALS — BP 102/60 | HR 80 | Resp 14 | Wt 236.0 lb

## 2015-11-10 DIAGNOSIS — N9489 Other specified conditions associated with female genital organs and menstrual cycle: Secondary | ICD-10-CM

## 2015-11-10 DIAGNOSIS — N898 Other specified noninflammatory disorders of vagina: Secondary | ICD-10-CM

## 2015-11-10 DIAGNOSIS — Z8742 Personal history of other diseases of the female genital tract: Secondary | ICD-10-CM

## 2015-11-10 DIAGNOSIS — N83201 Unspecified ovarian cyst, right side: Secondary | ICD-10-CM

## 2015-11-10 NOTE — Progress Notes (Signed)
GYNECOLOGY  VISIT   HPI: 25 y.o.   Single  Caucasian  female   G0P0 with Patient's last menstrual period was 10/28/2015.   here for a pelvic U/S due to f/u on a h/o a right ovarian cyst that was seen in early June, 2017. The pain is gone. She is wondering about her ability to get pregnant. She had unprotected intercourse for up to 2 years without conception. She is not currently trying to get pregnant. She does currently have a partner, mostly using condoms. She is also c/o vaginal odor and discharge for the last 2 days. No itching, burning or irritation.     GYNECOLOGIC HISTORY: Patient's last menstrual period was 10/28/2015. Contraception:none Menopausal hormone therapy: none        OB History    Gravida Para Term Preterm AB TAB SAB Ectopic Multiple Living   0 0                 There are no active problems to display for this patient.   Past Medical History  Diagnosis Date  . HSV-1 (herpes simplex virus 1) infection   . Chlamydia contact, treated   . Trichimoniasis 11/09  . Gallstones   . ADHD (attention deficit hyperactivity disorder)   . Headache   . PID (acute pelvic inflammatory disease)     Past Surgical History  Procedure Laterality Date  . Implanon      removed 12/07/11  . Wisdom tooth extraction    . Closed reduction nasal fracture    . Dilatation & curettage/hysteroscopy with myosure N/A 04/01/2015    Procedure: DILATATION & CURETTAGE/HYSTEROSCOPY with Suction;  Surgeon: Romualdo Bolk, MD;  Location: [facility];  Service: Gynecology;  Laterality: N/A;  . Hysteroscopy      Current Outpatient Prescriptions  Medication Sig Dispense Refill  . amphetamine-dextroamphetamine (ADDERALL XR) 20 MG 24 hr capsule Take 20 mg by mouth daily.    Marland Kitchen HYDROcodone-acetaminophen (NORCO/VICODIN) 5-325 MG tablet   0  . meloxicam (MOBIC) 15 MG tablet Take 15 mg by mouth daily as needed for pain. Reported on 10/06/2015    . topiramate (TOPAMAX) 50 MG tablet Take 1 tablet  (50 mg total) by mouth at bedtime. 10 tablet 1  . valACYclovir (VALTREX) 1000 MG tablet Take 1,000 mg by mouth 2 (two) times daily as needed (For cold sores.). Reported on 10/06/2015    . Vitamins A & D (VITAMIN A & D) ointment Apply 1 application topically as needed (Applied to tattoos.).     No current facility-administered medications for this visit.     ALLERGIES: Review of patient's allergies indicates no known allergies.  Family History  Problem Relation Age of Onset  . Diabetes Maternal Grandmother   . Lymphoma Paternal Grandmother   . Lung cancer Paternal Grandfather   . Kidney disease Mother     Social History   Social History  . Marital Status: Single    Spouse Name: N/A  . Number of Children: N/A  . Years of Education: N/A   Occupational History  . Not on file.   Social History Main Topics  . Smoking status: Former Smoker -- 0.10 packs/day for 0 years    Types: Cigarettes  . Smokeless tobacco: Never Used  . Alcohol Use: 0.6 oz/week    1 Standard drinks or equivalent per week  . Drug Use: No  . Sexual Activity:    Partners: Male   Other Topics Concern  . Not  on file   Social History Narrative    Review of Systems  Constitutional: Negative.   HENT: Negative.   Eyes: Negative.   Respiratory: Negative.   Cardiovascular: Negative.   Gastrointestinal: Negative.   Genitourinary:       Vaginal discharge  Musculoskeletal: Negative.   Skin: Negative.   Neurological: Negative.   Endo/Heme/Allergies: Negative.   Psychiatric/Behavioral: Negative.     PHYSICAL EXAMINATION:    BP 102/60 mmHg  Pulse 80  Resp 14  Wt 236 lb (107.049 kg)  LMP 10/28/2015    General appearance: alert, cooperative and appears stated age  Pelvic: External genitalia:  no lesions              Urethra:  normal appearing urethra with no masses, tenderness or lesions              Bartholins and Skenes: normal                 Vagina: normal appearing vagina with an increase in  watery              Cervix: no lesions  Chaperone was present for exam.  ASSESSMENT H/O ovarian cyst, resolved Vaginal odor, suspect BV, the patient reports multiple infections in the last year. On review of her records, she has had 2 documented episodes of BV since 1/17    PLAN Vaginitis probe Recommended she use condoms, should help decrease her risk of STD's and BV If she has more than 3 documented episodes of BV with in a year would recommend suppression No further ultrasound needed     An After Visit Summary was printed and given to the patient.

## 2015-11-11 LAB — WET PREP BY MOLECULAR PROBE
Candida species: NEGATIVE
GARDNERELLA VAGINALIS: POSITIVE — AB
TRICHOMONAS VAG: NEGATIVE

## 2015-11-12 ENCOUNTER — Telehealth: Payer: Self-pay

## 2015-11-12 MED ORDER — METRONIDAZOLE 500 MG PO TABS: 500.0000 mg | ORAL_TABLET | Freq: Two times a day (BID) | ORAL | Status: DC

## 2015-11-12 NOTE — Telephone Encounter (Signed)
-----   Message from Romualdo BolkJill Evelyn Jertson, MD sent at 11/11/2015 12:26 PM EDT ----- Please inform the patient that her vaginitis probe was + for BV and treat with flagyl (either oral or vaginal, her choice), no ETOH while on Flagyl.  Oral: Flagyl 500 mg BID x 7 days, or Vaginal: Metrogel, 1 applicator per vagina q day x 5 days.

## 2015-11-12 NOTE — Telephone Encounter (Signed)
Please let her know that I reviewed her records (see note), she needs to have one more episode of BV before I would put her on suppression

## 2015-11-12 NOTE — Telephone Encounter (Signed)
Left message to call Kaitlyn at [phone]. 

## 2015-11-12 NOTE — Telephone Encounter (Signed)
Spoke with patient. Advised of results as seen below from Dr.Jertson. She is agreeable and verbalizes understanding. Patient would like to take Flagyl for treatment at this time. ETOH precautions given. Patient is asking if Metrogel can also be sent to her pharmacy. "We discussed using the Flagyl this time and then using the Metrogel once per week due to how often I am getting infections." Advised I will speak with Dr.Jertson regarding medication request and return call. She is agreeable. Order for Flagyl 500 mg bid x 7 days #14 0RF sent to pharmacy at this time.

## 2015-11-12 NOTE — Telephone Encounter (Signed)
Spoke with patient. Advised of message as seen below from Dr.Jertson. She is agreeable and verbalizes understanding.  Routing to provider for final review. Patient agreeable to disposition. Will close encounter.  

## 2015-11-23 ENCOUNTER — Telehealth: Payer: Self-pay | Admitting: Obstetrics and Gynecology

## 2015-11-23 NOTE — Telephone Encounter (Signed)
In June at her annual exam, her menses were documented as regular. Is this the first time she has had abnormal bleeding? She should check a UPT, as long as it is negative she should calendar her cycles for the next 2-3 months and bring it in for review. If she is bleeding heavily or having prolonged episodes of bleeding (more than 7-8 days) she should call sooner.

## 2015-11-23 NOTE — Telephone Encounter (Signed)
Spoke with patient. Patient states that she started her menses today. LMP was 10/28/2015-11/01/2015. Reports her bleeding is "normal."  Denies any heavy bleeding. "It is like I am just having another cycle, it is early." Reports her LMP prior to July was on 10/14/2015-10/18/2015. Patient is concerned that she is having her menses so frequently. She is not on any form of birth control. States that she does not want to be on any form of birth control as she is concerned she is unable to have kids and this will lessen her chances. Patient had a PUS on 11/10/2015 with Dr.Jertson which was normal. Advised I will speak with Dr.Jertson regarding concerns and recommendations and return call. She is agreeable.

## 2015-11-23 NOTE — Telephone Encounter (Signed)
Patient wants to speak with Kaitlyn. No information given. °

## 2015-11-24 NOTE — Telephone Encounter (Signed)
Spoke with patient. Patient states that her cycles began coming more frequently after she was seen in the ER in June, 2017. Denies any heavy or prolonged bleeding at this time. Denies fatigue, SOB, weakness, or dizziness. Advised of message as seen below from Dr.Jertson. She is agreeable and verbalizes understanding. States she has been having unprotected intercourse. Advised she will need to take a UPT, as long as this is negative she will need to monitor her menses and return for a recheck in  2 months. If positive she will need to contact the office for a confirmation appointment. 2 month recheck scheduled for 01/25/2016 at 4 pm with Dr.Jertson. She is agreeable to date and time. Advised if bleeding becomes heavy or prolonged (more than 7-8 days) she will need to be seen in the office for earlier evaluation. She is agreeable.   Routing to provider for final review. Patient agreeable to disposition. Will close encounter.

## 2015-11-24 NOTE — Telephone Encounter (Signed)
Left message to call Kaitlyn at [phone]. 

## 2015-12-11 ENCOUNTER — Telehealth: Payer: Self-pay | Admitting: Obstetrics and Gynecology

## 2015-12-11 MED ORDER — METRONIDAZOLE 500 MG PO TABS: 500.0000 mg | ORAL_TABLET | Freq: Two times a day (BID) | ORAL | 0 refills | Status: DC

## 2015-12-11 NOTE — Telephone Encounter (Signed)
Spoke with patient. Patient states that 4 days ago she began to have vaginal odor. Reports this is the first symptoms she always gets with BV. States that she has had BV multiple times since January and is sure she has this again. States 3 days ago she started taking Flagyl 500 mg bid that she had left over from another time she had BV. She has taken this for 3 days and feels her symptoms are decreasing. Requesting rx for 4 more days of Flagyl be sent to her pharmacy on file. Aware we do not treat over the phone. Patient declines an appointment at this time stating she has had BV so often that she knows this is what it is. Advised I will speak with covering provider and return call with further recommendations. She is agreeable.  Since January 2017 patient has had 3 positive affirm tests for BV.  Routing to Dr.Miller for review and advise.

## 2015-12-11 NOTE — Telephone Encounter (Signed)
Patient notified that Dr Hyacinth MeekerMiller has sent prescription to pharmacy. Advised that if symptoms persist, she will need office visit.   Encounter closed.

## 2015-12-11 NOTE — Telephone Encounter (Signed)
Patient would like to speak with nurse about getting a prescription for a bacterial infection. Does not want to schedule.

## 2015-12-11 NOTE — Telephone Encounter (Signed)
Left message to call Joli Koob at [phone]. 

## 2015-12-11 NOTE — Telephone Encounter (Signed)
Rx sent to pharmacy for flagyl 500mg  bid x 4 more days.  #8 tabs/0RF.

## 2015-12-11 NOTE — Telephone Encounter (Signed)
Patient is returning a call to Kaitlyn. °

## 2015-12-11 NOTE — Telephone Encounter (Signed)
Patient checking on status of return call. I informed patient the triage nurse is waiting to hear back from Dr.Miller.

## 2015-12-29 ENCOUNTER — Ambulatory Visit (INDEPENDENT_AMBULATORY_CARE_PROVIDER_SITE_OTHER): Payer: BLUE CROSS/BLUE SHIELD | Admitting: Obstetrics and Gynecology

## 2015-12-29 ENCOUNTER — Encounter: Payer: Self-pay | Admitting: Obstetrics and Gynecology

## 2015-12-29 VITALS — BP 122/84 | HR 88 | Resp 14 | Wt 235.0 lb

## 2015-12-29 DIAGNOSIS — N760 Acute vaginitis: Secondary | ICD-10-CM

## 2015-12-29 DIAGNOSIS — A499 Bacterial infection, unspecified: Secondary | ICD-10-CM

## 2015-12-29 DIAGNOSIS — N926 Irregular menstruation, unspecified: Secondary | ICD-10-CM | POA: Diagnosis not present

## 2015-12-29 DIAGNOSIS — B9689 Other specified bacterial agents as the cause of diseases classified elsewhere: Secondary | ICD-10-CM

## 2015-12-29 DIAGNOSIS — N644 Mastodynia: Secondary | ICD-10-CM

## 2015-12-29 DIAGNOSIS — N898 Other specified noninflammatory disorders of vagina: Secondary | ICD-10-CM | POA: Diagnosis not present

## 2015-12-29 DIAGNOSIS — N9489 Other specified conditions associated with female genital organs and menstrual cycle: Secondary | ICD-10-CM

## 2015-12-29 LAB — POCT URINE PREGNANCY: Preg Test, Ur: NEGATIVE

## 2015-12-29 MED ORDER — METRONIDAZOLE 0.75 % VA GEL: VAGINAL | 5 refills | Status: DC

## 2015-12-29 MED ORDER — METRONIDAZOLE 500 MG PO TABS: 500.0000 mg | ORAL_TABLET | Freq: Two times a day (BID) | ORAL | 0 refills | Status: DC

## 2015-12-29 NOTE — Progress Notes (Signed)
GYNECOLOGY  VISIT   HPI: 25 y.o.   Single  Caucasian  female   G0P0 with Patient's last menstrual period was 12/16/2015.   here c/o vaginal discharge, odor and breast pain. The discharge and order started 3 days ago. No itching, burning or irritaiton. The patient c/o bilateral breast pain for the last month, L>R.    She c/o having 2 cycles a month, 7/5, 7/30, 8/23.  Not sexually active in the last few weeks, using condoms off and on.   GYNECOLOGIC HISTORY: Patient's last menstrual period was 12/16/2015. Contraception:none  Menopausal hormone therapy: none        OB History    Gravida Para Term Preterm AB Living   0 0           SAB TAB Ectopic Multiple Live Births                     There are no active problems to display for this patient.   Past Medical History:  Diagnosis Date  . ADHD (attention deficit hyperactivity disorder)   . Chlamydia contact, treated   . Gallstones   . Headache   . HSV-1 (herpes simplex virus 1) infection   . PID (acute pelvic inflammatory disease)   . Trichimoniasis 11/09    Past Surgical History:  Procedure Laterality Date  . CLOSED REDUCTION NASAL FRACTURE    . DILATATION & CURETTAGE/HYSTEROSCOPY WITH MYOSURE N/A 04/01/2015   Procedure: DILATATION & CURETTAGE/HYSTEROSCOPY with Suction;  Surgeon: Romualdo Bolk, MD;  Location: [facility];  Service: Gynecology;  Laterality: N/A;  . HYSTEROSCOPY    . implanon     removed 12/07/11  . WISDOM TOOTH EXTRACTION      Current Outpatient Prescriptions  Medication Sig Dispense Refill  . amphetamine-dextroamphetamine (ADDERALL XR) 20 MG 24 hr capsule Take 20 mg by mouth daily.    Marland Kitchen HYDROcodone-acetaminophen (NORCO/VICODIN) 5-325 MG tablet   0  . meloxicam (MOBIC) 15 MG tablet Take 15 mg by mouth daily as needed for pain. Reported on 10/06/2015    . topiramate (TOPAMAX) 50 MG tablet Take 1 tablet (50 mg total) by mouth at bedtime. 10 tablet 1  . valACYclovir (VALTREX) 1000 MG tablet Take 1,000 mg by  mouth 2 (two) times daily as needed (For cold sores.). Reported on 10/06/2015    . Vitamins A & D (VITAMIN A & D) ointment Apply 1 application topically as needed (Applied to tattoos.).     No current facility-administered medications for this visit.      ALLERGIES: Review of patient's allergies indicates no known allergies.  Family History  Problem Relation Age of Onset  . Diabetes Maternal Grandmother   . Kidney disease Mother   . Lymphoma Paternal Grandmother   . Lung cancer Paternal Grandfather     Social History   Social History  . Marital status: Single    Spouse name: N/A  . Number of children: N/A  . Years of education: N/A   Occupational History  . Not on file.   Social History Main Topics  . Smoking status: Former Smoker    Packs/day: 0.10    Years: 0.00    Types: Cigarettes  . Smokeless tobacco: Never Used  . Alcohol use 0.6 oz/week    1 Standard drinks or equivalent per week  . Drug use: No  . Sexual activity: Yes    Partners: Male   Other Topics Concern  . Not on file   Social History Narrative  .  No narrative on file    Review of Systems  Constitutional: Positive for malaise/fatigue.  HENT: Negative.   Eyes: Negative.   Respiratory: Negative.   Cardiovascular: Negative.   Gastrointestinal: Negative.   Genitourinary:       Vaginal discharge and odor Breast pain   Musculoskeletal: Negative.   Skin: Negative.   Neurological: Negative.   Endo/Heme/Allergies: Negative.   Psychiatric/Behavioral: Negative.     PHYSICAL EXAMINATION:    BP 122/84 (BP Location: Right Arm, Patient Position: Sitting, Cuff Size: Normal)   Pulse 88   Resp 14   Wt 235 lb (106.6 kg)   LMP 12/16/2015   BMI 37.36 kg/m     General appearance: alert, cooperative and appears stated age Breasts: normal appearance, no masses, bilaterally tender in the upper, outer quadrants Abdomen: soft, non-tender; bowel sounds normal; no masses,  no organomegaly  Pelvic: External  genitalia:  no lesions              Urethra:  normal appearing urethra with no masses, tenderness or lesions              Bartholins and Skenes: normal                 Vagina: normal appearing vagina with a slight increase in white watery and clumpy vaginal d/c              Cervix:  No lesions              Chaperone was present for exam.  Wet prep: + clue, no trich, few wbc KOH: no yeast PH: 5   ASSESSMENT Vaginal d/c, Bacterial vaginitis on vaginal slides Recurrent BV Breast tenderness, normal exam, suspect hormonal Recent shortening of her cycles, still within the normal range    PLAN Will treat with flagyl x 10 days, then metrogel 2 x a week x 4-6 months UPT negative Will send wet prep probe to r/o trich and yeast She declines other STD testing Avoid caffeine Call if breast tenderness persists     An After Visit Summary was printed and given to the patient.

## 2015-12-30 LAB — WET PREP BY MOLECULAR PROBE
Candida species: NEGATIVE
GARDNERELLA VAGINALIS: POSITIVE — AB
Trichomonas vaginosis: NEGATIVE

## 2016-01-25 ENCOUNTER — Ambulatory Visit: Payer: BLUE CROSS/BLUE SHIELD | Admitting: Obstetrics and Gynecology

## 2016-01-25 ENCOUNTER — Telehealth: Payer: Self-pay | Admitting: Obstetrics and Gynecology

## 2016-01-25 NOTE — Telephone Encounter (Signed)
Patient dnka her 2 month recheck appointment today at 4:00pm. Patient had confirmed this appointment on Friday [facility]/22/16. [facility]

## 2016-02-23 ENCOUNTER — Telehealth: Payer: Self-pay | Admitting: Obstetrics and Gynecology

## 2016-02-23 NOTE — Telephone Encounter (Signed)
Patient is requesting to speak with a nurse about some things.  Would not give any other information.  She also states she may need to be tested for STD's

## 2016-02-24 ENCOUNTER — Encounter: Payer: Self-pay | Admitting: Obstetrics and Gynecology

## 2016-02-24 ENCOUNTER — Ambulatory Visit (INDEPENDENT_AMBULATORY_CARE_PROVIDER_SITE_OTHER): Payer: BLUE CROSS/BLUE SHIELD | Admitting: Obstetrics and Gynecology

## 2016-02-24 VITALS — BP 130/80 | HR 60 | Resp 14 | Wt 237.0 lb

## 2016-02-24 DIAGNOSIS — Z3169 Encounter for other general counseling and advice on procreation: Secondary | ICD-10-CM

## 2016-02-24 DIAGNOSIS — Z113 Encounter for screening for infections with a predominantly sexual mode of transmission: Secondary | ICD-10-CM

## 2016-02-24 DIAGNOSIS — N898 Other specified noninflammatory disorders of vagina: Secondary | ICD-10-CM | POA: Diagnosis not present

## 2016-02-24 NOTE — Progress Notes (Signed)
GYNECOLOGY  VISIT   HPI: 25 y.o.   Single  Caucasian  female   G0P0 with Patient's last menstrual period was 02/07/2016.   here for STD check. Her partner just got out of prison. She wants STD testing. She c/o infertility, has had unprotected sex with 2 partners over several years (separetly) without conception. Both of those partners subsequently got their partners pregnant.  She c/o monthly cycles x 5 days. Saturates a pad in 1.5-2 hours. Cramps are bad for 2 days. No PMS symptoms. She has a h/o Chlamydia.  She wants to get pregnant with her partner. They have been together on and off x 10 years (he was in jail for 8 years).  She c/o a vaginal odor. She has had recurrent episodes of BV over 3 in the last year.  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   GYNECOLOGIC HISTORY: Patient's last menstrual period was 02/07/2016. Contraception:none Menopausal hormone therapy: none        OB History    Gravida Para Term Preterm AB Living   0 0           SAB TAB Ectopic Multiple Live Births                     There are no active problems to display for this patient.   Past Medical History:  Diagnosis Date  . ADHD (attention deficit hyperactivity disorder)   . Chlamydia contact, treated   . Gallstones   . Headache   . HSV-1 (herpes simplex virus 1) infection   . PID (acute pelvic inflammatory disease)   . Trichimoniasis 11/09    Past Surgical History:  Procedure Laterality Date  . CLOSED REDUCTION NASAL FRACTURE    . DILATATION & CURETTAGE/HYSTEROSCOPY WITH MYOSURE N/A 04/01/2015   Procedure: DILATATION & CURETTAGE/HYSTEROSCOPY with Suction;  Surgeon: Romualdo BolkJill Evelyn Jertson, MD;  Location: [facility];  Service: Gynecology;  Laterality: N/A;  . HYSTEROSCOPY    . implanon     removed 12/07/11  . WISDOM TOOTH EXTRACTION      Current Outpatient Prescriptions  Medication Sig Dispense Refill  . amphetamine-dextroamphetamine (ADDERALL XR) 20 MG 24 hr  capsule Take 20 mg by mouth daily.    Marland Kitchen. HYDROcodone-acetaminophen (NORCO/VICODIN) 5-325 MG tablet   0  . meloxicam (MOBIC) 15 MG tablet Take 15 mg by mouth daily as needed for pain. Reported on 10/06/2015    . metroNIDAZOLE (FLAGYL) 500 MG tablet Take 1 tablet (500 mg total) by mouth 2 (two) times daily. 20 tablet 0  . topiramate (TOPAMAX) 50 MG tablet Take 1 tablet (50 mg total) by mouth at bedtime. 10 tablet 1  . valACYclovir (VALTREX) 1000 MG tablet Take 1,000 mg by mouth 2 (two) times daily as needed (For cold sores.). Reported on 10/06/2015    . Vitamins A & D (VITAMIN A & D) ointment Apply 1 application topically as needed (Applied to tattoos.).     No current facility-administered medications for this visit.      ALLERGIES: Review of patient's allergies indicates no known allergies.  Family History  Problem Relation Age of Onset  . Diabetes Maternal Grandmother   . Kidney disease Mother   . Lymphoma Paternal Grandmother   . Lung cancer Paternal Grandfather     Social History   Social History  . Marital status: Single    Spouse name: N/A  . Number of children: N/A  . Years  of education: N/A   Occupational History  . Not on file.   Social History Main Topics  . Smoking status: Former Smoker    Packs/day: 0.10    Years: 0.00    Types: Cigarettes  . Smokeless tobacco: Never Used  . Alcohol use 0.6 oz/week    1 Standard drinks or equivalent per week  . Drug use: No  . Sexual activity: Yes    Partners: Male   Other Topics Concern  . Not on file   Social History Narrative  . No narrative on file    Review of Systems  Constitutional: Negative.   HENT: Negative.   Eyes: Negative.   Respiratory: Negative.   Cardiovascular: Negative.   Gastrointestinal: Negative.   Genitourinary:       Vaginal odor  Musculoskeletal: Negative.   Skin: Negative.   Neurological: Negative.   Endo/Heme/Allergies: Negative.   Psychiatric/Behavioral: Negative.     PHYSICAL  EXAMINATION:    BP 130/80 (BP Location: Right Arm, Patient Position: Sitting, Cuff Size: Normal)   Pulse 60   Resp 14   Wt 237 lb (107.5 kg)   LMP 02/07/2016   BMI 37.68 kg/m     General appearance: alert, cooperative and appears stated age  Pelvic: External genitalia:  no lesions              Urethra:  normal appearing urethra with no masses, tenderness or lesions              Bartholins and Skenes: normal                 Vagina: normal appearing vagina with normal color and discharge, no lesions              Cervix:without lesions Chaperone was present for exam.  ASSESSMENT Infertility, h/o chlamydia, normal cycles Recurrent BV, now with symptoms of vaginal odor Screening for STD    PLAN Recommended she go on [facility] charts [facility] (will pre-treat with antibiotics) Wet prep probe, if + for BV will put on suppression  Discussed infertility, also discussed need for early evaluation when she is pregnant to r/o ectopic   An After Visit Summary was printed and given to the patient.  25 minutes face to face time of which over 50% was spent in counseling.

## 2016-02-24 NOTE — Patient Instructions (Signed)
Infertility Infertility is when you are unable to get pregnant (conceive) after a year of having sex regularly without using birth control. Infertility can also mean that a woman is not able to carry a pregnancy to full term.  Both women and men can have fertility problems. WHAT CAUSES INFERTILITY? What Causes Infertility in Women? There are many possible causes of infertility in women. For some women, the cause of infertility is not known (unexplained infertility). Infertility can also be linked to more than one cause. Infertility problems in women can be caused by problems with the menstrual cycle or reproductive organs, certain medical conditions, and factors related to lifestyle and age.  Problems with your menstrual cycle can interfere with your ovaries producing eggs (ovulation). This can make it difficult to get pregnant. This includes having a menstrual cycle that is very long, very short, or irregular.  Problems with reproductive organs can include:  An abnormally narrow cervix or a cervix that does not remain closed during a pregnancy.  A blockage in your fallopian tubes.  An abnormally shaped uterus.  Uterine fibroids. This is a tissue mass (tumor) that can develop on your uterus.  Medical conditions that can affect a woman's fertility include:  Polycystic ovarian syndrome (PCOS). This is a hormonal disorder that can cause small cysts to grow on your ovaries. This is the most common cause of infertility in women.  Endometriosis. This is a condition in which the tissue that lines your uterus (endometrium) grows outside of its normal location.  Primary ovary insufficiency. This is when your ovaries stop producing eggs and hormones before the age of 40.  Sexually transmitted diseases, such as chlamydia or gonorrhea. These infections can cause scarring in your fallopian tubes. This makes it difficult for eggs to reach your uterus.  Autoimmune disorders. These are disorders in  which your immune system attacks normal, healthy cells.  Hormone imbalances.  Other factors include:  Age. A woman's fertility declines with age, especially after her mid-30s.  Being under- or overweight.  Drinking too much alcohol.  Using drugs.  Exercising excessively.  Being exposed to environmental toxins, such as radiation, pesticides, and certain chemicals. What Causes Infertility in Men? There are many causes of infertility in men. Infertility can be linked to more than one cause. Infertility problems in men can be caused by problems with sperm or the reproductive organs, certain medical conditions, and factors related to lifestyle and age. Some men have unexplained infertility.   Problems with sperm. Infertility can result if there is a problem producing:  Enough sperm (low sperm count).  Enough normally-shaped sperm (sperm morphology).  Sperm that are able to reach the egg (poor motility).  Infertility can also be caused by:  A problem with hormones.  Enlarged veins (varicoceles), cysts (spermatoceles), or tumors of the testicles.  Sexual dysfunction.  Injury to the testicles.  A birth defect, such as not having the tubes that carry sperm (vas deferens).  Medical conditions that can affect a man's fertility include:  Diabetes.  Cancer treatments, such as chemotherapy or radiation.  Klinefelter syndrome. This is an inherited genetic disorder.   Thyroid problems, such as an under- or overactive thyroid.  Cystic fibrosis.  Sexually transmitted diseases.  Other factors include:  Age. A man's fertility declines with age.  Drinking too much alcohol.  Using drugs.  Being exposed to environmental toxins, such as pesticides and lead. WHAT ARE THE SYMPTOMS OF INFERTILITY? Being unable to get pregnant after one year of having   regular sex without using birth control is the only sign of infertility.  HOW IS INFERTILITY DIAGNOSED? In order to be diagnosed  with infertility, both partners will have a physical exam. Both partners will also have an extensive medical and sexual history taken. If there is no obvious reason for infertility, additional tests may be done. What Tests Will Women Have? Women may first have tests to check whether they are ovulating each month. The tests may include:  Blood tests to check hormone levels.  An ultrasound of the ovaries. This looks for possible problems on or in the ovaries.  Taking a small sample of the tissue that lines the uterus for examination under a microscope (endometrial biopsy). Women who are ovulating may have additional tests. These may include:  Hysterosalpingography.  This is an X-ray of the fallopian tubes and uterus taken after a specific type of dye is injected.  This test can show the shape of the uterus and whether the fallopian tubes are open.  Laparoscopy.  In this test, a lighted tube (laparoscope) is used to look for problems in the fallopian tubes and other female organs.  Transvaginal ultrasound.  This is an imaging test to check for abnormalities of the uterus and ovaries.  A health care provider can use this test to count the number of follicles on the ovaries.  Hysteroscopy.  This test involves using a lighted tube to examine the cervix and inside the uterus.  It is done to find any abnormalities inside the uterus. What Tests Will Men Have? Tests for men's infertility includes:  Semen tests to check sperm count, morphology, and motility.  Blood tests to check for hormone levels.  Taking a small sample of tissue from inside a testicle (biopsy). This is examined under a microscope.  Blood tests to check for genetic abnormalities (genetic testing). HOW ARE WOMEN TREATED FOR INFERTILITY?  Treatment depends on the cause of infertility. Most cases of infertility in women are treated with medicine or surgery.  Women may take medicine to:  Correct ovulation  problems.  Treat other health conditions, such as PCOS.  Surgery may be done to:  Repair damage to the ovaries, fallopian tubes, cervix, or uterus.  Remove growths from the uterus.  Remove scar tissue from the uterus, pelvis, or other female organs. HOW ARE MEN TREATED FOR INFERTILITY?  Treatment depends on the cause of infertility. Most cases of infertility in men are treated with medicine or surgery.   Men may take medicine to:  Correct hormone problems.  Treat other health conditions.  Treat sexual dysfunction.  Surgery may be done to:  Remove blockages in the reproductive tract.  Correct other structural problems of the reproductive tract. WHAT IS ASSISTED REPRODUCTIVE TECHNOLOGY? Assisted reproductive technology (ART) refers to all treatments and procedures that combine eggs and sperm outside the body to try to help a couple conceive. ART is often combined with fertility drugs to stimulate ovulation. Sometimes ART is done using eggs retrieved from another woman's body (donor eggs) or from previously frozen fertilized eggs (embryos).  There are different types of ART. These include:   Intrauterine insemination (IUI).  In this procedure, sperm is placed directly into a woman's uterus with a long, thin tube.  This may be most effective for infertility caused by sperm problems, including low sperm count and low motility.  Can be used in combination with fertility drugs.  In vitro fertilization (IVF).  This is often done when a woman's fallopian tubes are blocked   or when a man has low sperm counts.  Fertility drugs stimulate the ovaries to produce multiple eggs. Once mature, these eggs are removed from the body and combined with the sperm to be fertilized.  These fertilized eggs are then placed in the woman's uterus.   This information is not intended to replace advice given to you by your health care provider. Make sure you discuss any questions you have with your  health care provider.   Document Released: 04/14/2003 Document Revised: 12/31/2014 Document Reviewed: 12/25/2013 Elsevier Interactive Patient Education 2016 Elsevier Inc. Hysterosalpingography Hysterosalpingography is a procedure to look inside your uterus and fallopian tubes. During this procedure, contrast dye is injected into your uterus through your vagina and cervix to illuminate your uterus while X-ray pictures are taken. This procedure may help your health care provider determine whether you have uterine tumors, adhesions, or structural abnormalities. It is commonly used to help determine why a woman is unable to have children (infertility). The procedure usually lasts about 15-30 minutes. LET [facility] PROVIDER KNOW ABOUT:  Any allergies you have.  All medicines you are taking, including vitamins, herbs, eye drops, creams, and over-the-counter medicines.  Previous problems you or members of your family have had with the use of anesthetics.  Any blood disorders you have.  Previous surgeries you have had.  Medical conditions you have. RISKS AND COMPLICATIONS  Generally, this is a safe procedure. However, as with any procedure, problems can occur. Possible problems include:  Infection in the lining of the uterus (endometritis) or fallopian tubes (salpingitis).  Damage or perforation of the uterus or fallopian tubes.  An allergic reaction to the contrast dye used to perform the X-ray. BEFORE THE PROCEDURE   Schedule the procedure after your period stops, but before your next ovulation. This is usually between day 5 and day 10 of your last period. Day 1 is the first day of your period.  Ask your health care provider about changing or stopping your regular medicines.  You may eat and drink as normal.  Empty your bladder before the procedure begins. PROCEDURE  You may be given a medicine to relax you (sedative) or an over-the-counter pain medicine to lessen any  discomfort during the procedure.  You will lie down on an X-ray table with your feet in stirrups.  A device called a speculum will be placed into your vagina. This allows your health care provider to see inside your vagina to the cervix.  The cervix will be washed with a special soap.  A thin, flexible tube will be passed through the cervix into the uterus.  Contrast dye will be put into this tube.  Several X-rays will be taken as the contrast dye spreads through the uterus and fallopian tubes.  The tube will be taken out after the procedure. AFTER THE PROCEDURE   Most of the contrast dye will flow out of your vagina naturally. You may want to wear a sanitary pad.  You may feel mild cramping and notice a little bleeding from your vagina. This should go away in 24 hours.  Ask when your test results will be ready. Make sure you get your test results.   This information is not intended to replace advice given to you by your health care provider. Make sure you discuss any questions you have with your health care provider.   Document Released: 05/14/2004 Document Revised: 04/16/2013 Document Reviewed: 10/12/2012 Elsevier Interactive Patient Education [employer] Elsevier Inc.

## 2016-02-24 NOTE — Telephone Encounter (Signed)
Spoke with patient. Patient states she would like OV for STD testing. Patient states no new partner, "want to leet him know I'm good". Denies any urinary or GYN complaints. Patient scheduled for 02/24/16 at 12:45pm with Dr. Oscar LaJertson. Patient agreeable to date and time.   Routing to provider for final review. Patient is agreeable to disposition. Will close encounter.

## 2016-02-25 ENCOUNTER — Telehealth: Payer: Self-pay | Admitting: *Deleted

## 2016-02-25 DIAGNOSIS — N979 Female infertility, unspecified: Secondary | ICD-10-CM

## 2016-02-25 DIAGNOSIS — Z8619 Personal history of other infectious and parasitic diseases: Secondary | ICD-10-CM

## 2016-02-25 LAB — WET PREP BY MOLECULAR PROBE
Candida species: NEGATIVE
Gardnerella vaginalis: POSITIVE — AB
TRICHOMONAS VAG: NEGATIVE

## 2016-02-25 LAB — HEPATITIS C ANTIBODY: HCV Ab: NEGATIVE

## 2016-02-25 LAB — HSV(HERPES SIMPLEX VRS) I + II AB-IGG: HSV 1 GLYCOPROTEIN G AB, IGG: 29.2 {index} — AB (ref <0.90)

## 2016-02-25 LAB — GC/CHLAMYDIA PROBE AMP
CT Probe RNA: NOT DETECTED
GC PROBE AMP APTIMA: NOT DETECTED

## 2016-02-25 LAB — STD PANEL
HEP B S AG: NEGATIVE
HIV: NONREACTIVE

## 2016-02-25 NOTE — Telephone Encounter (Signed)
Per chart, LMP 02-07-16.  Call to patient regarding HSG scheduling. Advised she will need to call with onset of next cycle. [facility] schedule test which is cycle date specific.  Patient requesting I schedule the test now based on expected cycle date of 03-05-16. Advised patient that due to nature of this test, and that she is not on contraception so conception could occur, she should call with onset of cycle so that test could be scheduled based on actual cycle start date.  Patient agreeable and knows to call and ask for triage nurse.   Routing to provider for final review. Patient agreeable to disposition. Will close encounter.

## 2016-02-25 NOTE — Telephone Encounter (Signed)
-----   Message from Jill Evelyn JerRomualdo Bolktson, MD sent at 02/24/2016  5:12 PM EDT ----- Please set her up for an HSG, she will need pre-treatment with doxycyline. 100 mg bid x 5 days, start the night prior to HSG. Thanks, Noreene LarssonJill

## 2016-02-27 LAB — HSV 1/2 AB (IGM), IFA W/RFLX TITER
HSV 1 IGM SCREEN: NEGATIVE
HSV 2 IgM Screen: NEGATIVE

## 2016-02-29 ENCOUNTER — Telehealth: Payer: Self-pay | Admitting: *Deleted

## 2016-02-29 NOTE — Telephone Encounter (Signed)
Spoke with patient. Patient unable to discuss results at this time. Advised to return call to [phone] when available to discuss.

## 2016-02-29 NOTE — Telephone Encounter (Signed)
-----   Message from Romualdo BolkJill Evelyn Jertson, MD sent at 02/29/2016  1:20 PM EST ----- Please inform the patient that her vaginitis probe was + for BV and treat with flagyl (either oral or vaginal, her choice), no ETOH while on Flagyl.  Oral: Flagyl 500 mg BID x 7 days, or Vaginal: Metrogel, 1 applicator per vagina q day x 5 days. 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

## 2016-03-01 MED ORDER — METRONIDAZOLE 500 MG PO TABS: 500.0000 mg | ORAL_TABLET | Freq: Two times a day (BID) | ORAL | 0 refills | Status: DC

## 2016-03-01 NOTE — Telephone Encounter (Signed)
Spoke with patient, advised as seen below per Dr. Salli QuarryJertson's request.Order placed for Flagyl Oral and pharmacy verified. Advised to Avoid alcohol during treatment and 24 hours after completing medication. Don't mix with alcohol if mixed can cause severe nausea, vomiting and abdominal cramping. Normal side effects of Flagyl may include metallic taste in mouth, slight nausea, headache, abdominal cramping, diarrhea and dizziness.  Patient verbalizes understanding and is agreeable.   Routing to provider for final review. Patient is agreeable to disposition. Will close encounter.

## 2016-03-03 ENCOUNTER — Telehealth: Payer: Self-pay | Admitting: Obstetrics and Gynecology

## 2016-03-03 MED ORDER — DOXYCYCLINE HYCLATE 100 MG PO TABS: 100.0000 mg | ORAL_TABLET | Freq: Two times a day (BID) | ORAL | 0 refills | Status: DC

## 2016-03-03 NOTE — Telephone Encounter (Signed)
Spoke with patient. Advised as seen below per Dr. Oscar LaJertson. Patient states her cycles have continued to be regular as previously discussed. Patient states she would like to continue with HSG on 03/09/16. Patient declines serum Bhcg at this time. Patient states she just wanted to know recommendations. Advised patient to call with any additional questions/concerns. Patient verbalizes understanding and is agreeable.  Dr. Oscar LaJertson, OK to close encounter?

## 2016-03-03 NOTE — Telephone Encounter (Signed)
As you told the patient, we do the HSG just after the menstrual cycle to insure she isn't pregnant. It is true that women can have irregular bleeding when they are pregnant, but they shouldn't have clockwork regular cycles.  If she has concerns that her cycles aren't regular (expressed they were at her last visit), we should hold on the testing. Have her use protection and do the HSG after her next cycle (with checking a blood pregnancy test prior to the HSG) If as previously discussed her cycles are regular and if she desires, we can check a serum Bhcg on 11/13. This will just insure she hasn't gotten pregnant prior to the last week.

## 2016-03-03 NOTE — Telephone Encounter (Signed)
Spoke with patient. Patient states menses started 03/03/16 and she would like to schedule HSG. RN asked availability for scheduling, patient states available for any appt time/date. Advised will return call to patient. Patient is agreeable.

## 2016-03-03 NOTE — Telephone Encounter (Signed)
Patient scheduled for HSG 03/09/16 at 0830.

## 2016-03-03 NOTE — Telephone Encounter (Signed)
Spoke with patient, advised of appt date and time as seen below. Ad[facility] min prior to appt time. Advised prescription sent to pharmacy on file for Doxycycline 100mg  BID x5 days -start the night prior to HSG. Patient verbalizes understanding and is agreeable to date and time.  Patient ask will any additional testing be done prior to HSG to assure patient not pregnant, advised patient that is why we wait to schedule when menses has started. Patient sates some people do still have cycles when pregnant. Advised patient I will review with Dr. Oscar LaJertson for any additional recommendations and return call.    Dr. Oscar LaJertson, please advise?   Cc: Harland DingwallSuzy Dixon,

## 2016-03-03 NOTE — Telephone Encounter (Signed)
Left message to call Bosten Newstrom at [phone].  

## 2016-03-03 NOTE — Telephone Encounter (Signed)
Patient calling to speak with Noreene LarssonJill. She started her cycle and wants to schedule an appointment.

## 2016-03-09 ENCOUNTER — Ambulatory Visit (HOSPITAL_COMMUNITY)
Admission: RE | Admit: 2016-03-09 | Discharge: 2016-03-09 | Disposition: A | Discharge status: Home / Self Care | Payer: BLUE CROSS/BLUE SHIELD | Source: Ambulatory Visit | Attending: Obstetrics and Gynecology | Admitting: Obstetrics and Gynecology

## 2016-03-09 DIAGNOSIS — N979 Female infertility, unspecified: Secondary | ICD-10-CM | POA: Insufficient documentation

## 2016-03-09 MED ORDER — IOPAMIDOL (ISOVUE-300) INJECTION 61%
30.0000 mL | Freq: Once | INTRAVENOUS | Status: AC | PRN
  Administered 2016-03-09: 2 mL

## 2016-03-09 NOTE — Telephone Encounter (Signed)
Dr. Jertson , ok to close encounter? 

## 2016-04-26 ENCOUNTER — Telehealth: Payer: Self-pay | Admitting: Obstetrics and Gynecology

## 2016-04-26 ENCOUNTER — Ambulatory Visit: Payer: BLUE CROSS/BLUE SHIELD | Admitting: Obstetrics and Gynecology

## 2016-04-26 NOTE — Telephone Encounter (Signed)
Patient canceled her 2 month recheck appointment today due to lack of insurance coverage. Patient did not wish to reschedule at this time.

## 2016-09-26 ENCOUNTER — Telehealth: Payer: Self-pay | Admitting: *Deleted

## 2016-09-26 NOTE — Telephone Encounter (Signed)
Left message on patients home number (mobile out of service) to call regarding 08 recall. Patient needs AEX Pamela Weber/PAP-eh

## 2016-10-10 NOTE — Telephone Encounter (Signed)
Left another message to call regarding 08 recall -eh

## 2016-10-25 NOTE — Telephone Encounter (Signed)
Tried to contact patient on both numbers on file with no return phone call. Please advise on recall status/ letter

## 2016-10-25 NOTE — Telephone Encounter (Signed)
Please send the patient a certified letter explaining the importance a pap smear. She had [facility] last year.

## 2016-10-31 ENCOUNTER — Encounter: Payer: Self-pay | Admitting: *Deleted

## 2016-10-31 NOTE — Telephone Encounter (Signed)
Letter sent - removed from recall -eh 

## 2016-11-29 ENCOUNTER — Encounter: Payer: Self-pay | Admitting: Certified Nurse Midwife

## 2016-11-29 ENCOUNTER — Other Ambulatory Visit (HOSPITAL_COMMUNITY)
Admission: RE | Admit: 2016-11-29 | Discharge: 2016-11-29 | Disposition: A | Discharge status: Home / Self Care | Payer: 59 | Source: Ambulatory Visit | Attending: Certified Nurse Midwife | Admitting: Certified Nurse Midwife

## 2016-11-29 ENCOUNTER — Ambulatory Visit (INDEPENDENT_AMBULATORY_CARE_PROVIDER_SITE_OTHER): Payer: 59 | Admitting: Certified Nurse Midwife

## 2016-11-29 VITALS — BP 120/78 | HR 70 | Resp 16 | Ht 66.5 in | Wt 228.0 lb

## 2016-11-29 DIAGNOSIS — Z87898 Personal history of other specified conditions: Secondary | ICD-10-CM | POA: Diagnosis not present

## 2016-11-29 DIAGNOSIS — Z01419 Encounter for gynecological examination (general) (routine) without abnormal findings: Secondary | ICD-10-CM

## 2016-11-29 DIAGNOSIS — Z0000 Encounter for general adult medical examination without abnormal findings: Secondary | ICD-10-CM

## 2016-11-29 DIAGNOSIS — Z124 Encounter for screening for malignant neoplasm of cervix: Secondary | ICD-10-CM | POA: Diagnosis not present

## 2016-11-29 DIAGNOSIS — Z8742 Personal history of other diseases of the female genital tract: Secondary | ICD-10-CM

## 2016-11-29 NOTE — Progress Notes (Signed)
26 y.o. G0P0 Single  Caucasian Fe here for annual exam.  Periods normal with no change in cramping. OTC Advil works well. Tired a lot with increase weight gain, but not exercising. Contraception none. Pregnancy Ok, if occurs. Partner change. Desires STD screening today. 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 Concerned she may have BV again, but using chronic BV treatment now. No recent HSV 1 outbreaks, has Rx. Sees PCP prn and will do TDAP there. No other health issues today.  Patient's last menstrual period was 11/21/2016 (exact date).          Sexually active: Yes.    The current method of family planning is none.    Exercising: No.  exercise Smoker:  no  Health Maintenance: Pap:  10-06-15 LGSIL History of Abnormal Pap: yes MMG:  none Self Breast exams: no Colonoscopy:  none BMD:   none TDaP:  2008 declined today Shingles: no Pneumonia: no Hep C and HIV: both neg 11/17 Labs: as needed.   reports that she has quit smoking. Her smoking use included Cigarettes. She smoked 0.10 packs per day for 0.00 years. She has never used smokeless tobacco. She reports that she drinks about 0.6 oz of alcohol per week . She reports that she does not use drugs.  Past Medical History:  Diagnosis Date  . ADHD (attention deficit hyperactivity disorder)   . Chlamydia contact, treated   . Gallstones   . Headache   . HSV-1 (herpes simplex virus 1) infection   . PID (acute pelvic inflammatory disease)   . Trichimoniasis 11/09    Past Surgical History:  Procedure Laterality Date  . CLOSED REDUCTION NASAL FRACTURE    . DILATATION & CURETTAGE/HYSTEROSCOPY WITH MYOSURE N/A 04/01/2015   Procedure: DILATATION & CURETTAGE/HYSTEROSCOPY with Suction;  Surgeon: Romualdo Bolk, MD;  Location: [facility];  Service: Gynecology;  Laterality: N/A;  . HYSTEROSCOPY    . implanon     removed 12/07/11  . WISDOM TOOTH EXTRACTION      Current Outpatient Prescriptions  Medication Sig Dispense  Refill  . amphetamine-dextroamphetamine (ADDERALL XR) 20 MG 24 hr capsule Take 20 mg by mouth daily.    . cyclobenzaprine (FLEXERIL) 10 MG tablet TK 1 T PO TID PRF MUSCLE SPASM  0  . metroNIDAZOLE (METROGEL) 0.75 % vaginal gel Place 1 Applicatorful vaginally 2 (two) times daily.    . metroNIDAZOLE (METROGEL) 0.75 % vaginal gel Place 1 Applicatorful vaginally. Using once or twice after menstrual cycle    . valACYclovir (VALTREX) 1000 MG tablet Take 1,000 mg by mouth 2 (two) times daily as needed (For cold sores.). Reported on 10/06/2015     No current facility-administered medications for this visit.     Family History  Problem Relation Age of Onset  . Diabetes Maternal Grandmother   . Kidney disease Mother   . Lymphoma Paternal Grandmother   . Lung cancer Paternal Grandfather     ROS:  Pertinent items are noted in HPI.  Otherwise, a comprehensive ROS was negative.  Exam:   BP 120/78   Pulse 70   Resp 16   Ht 5' 6.5" (1.689 m)   Wt 228 lb (103.4 kg)   LMP 11/21/2016 (Exact Date)   BMI 36.25 kg/m  Height: 5' 6.5" (168.9 cm) Ht Readings from Last 3 Encounters:  11/29/16 5' 6.5" (1.689 m)  10/06/15 5' 6.5" (1.689 m)  09/22/15 5' 6.5" (1.689 m)    General appearance: alert, cooperative and appears stated  age Head: Normocephalic, without obvious abnormality, atraumatic Neck: no adenopathy, supple, symmetrical, trachea midline and thyroid normal to inspection and palpation Lungs: clear to auscultation bilaterally Breasts: normal appearance, no masses or tenderness, No nipple retraction or dimpling, No nipple discharge or bleeding, No axillary or supraclavicular adenopathy Heart: regular rate and rhythm Abdomen: soft, non-tender; no masses,  no organomegaly Extremities: extremities normal, atraumatic, no cyanosis or edema Skin: Skin color, texture, turgor normal. No rashes or lesions, numerous tattoos Lymph nodes: Cervical, supraclavicular, and axillary nodes normal. No abnormal  inguinal nodes palpated Neurologic: Grossly normal   Pelvic: External genitalia:  no lesions              Urethra:  normal appearing urethra with no masses, tenderness or lesions              Bartholin's and Skene's: normal                 Vagina: normal appearing vagina with normal color and discharge, no lesions              Cervix: no bleeding following Pap, no cervical motion tenderness, no lesions and nulliparous appearance              Pap taken: Yes.   Bimanual Exam:  Uterus:  normal size, contour, position, consistency, mobility, non-tender              Adnexa: normal adnexa and no mass, fullness, tenderness               Rectovaginal: Confirms               Anus:  normal appearance  Chaperone present: yes  A:  Well Woman with normal exam  Contraception none desired  Follow up pap from [facility] in 2017 today  Chronic BV on treatment regimen  History of HSV 1 no outbreaks  Screening labs  P:   Reviewed health and wellness pertinent to exam  Discussed condom use for STD prevention  Continue BV treatment as prescribed  Has Rx Valtrex refills  Labs: RPR,HIV, Hep C, TSH   Pap smear: yes if negative repeat in one year, if not per results  counseled on breast self exam, STD prevention, HIV risk factors and prevention, adequate intake of calcium and vitamin D, diet and exercise  return annually or prn  An After Visit Summary was printed and given to the patient.

## 2016-11-29 NOTE — Patient Instructions (Signed)
General topics  Next pap or exam is  due in 1 year Take a Women's multivitamin Take 1200 mg. of calcium daily - prefer dietary If any concerns in interim to call back  Breast Self-Awareness Practicing breast self-awareness may pick up problems early, prevent significant medical complications, and possibly save your life. By practicing breast self-awareness, you can become familiar with how your breasts look and feel and if your breasts are changing. This allows you to notice changes early. It can also offer you some reassurance that your breast health is good. One way to learn what is normal for your breasts and whether your breasts are changing is to do a breast self-exam. If you find a lump or something that was not present in the past, it is best to contact your caregiver right away. Other findings that should be evaluated by your caregiver include nipple discharge, especially if it is bloody; skin changes or reddening; areas where the skin seems to be pulled in (retracted); or new lumps and bumps. Breast pain is seldom associated with cancer (malignancy), but should also be evaluated by a caregiver. BREAST SELF-EXAM The best time to examine your breasts is 5 7 days after your menstrual period is over.  ExitCare Patient Information 2013 [employer].   Exercise to Stay Healthy Exercise helps you become and stay healthy. EXERCISE IDEAS AND TIPS Choose exercises that:  You enjoy.  Fit into your day. You do not need to exercise really hard to be healthy. You can do exercises at a slow or medium level and stay healthy. You can:  Stretch before and after working out.  Try yoga, Pilates, or tai chi.  Lift weights.  Walk fast, swim, jog, run, climb stairs, bicycle, dance, or rollerskate.  Take aerobic classes. Exercises that burn about 150 calories:  Running 1  miles in 15 minutes.  Playing volleyball for 45 to 60 minutes.  Washing and waxing a car for 45 to 60  minutes.  Playing touch football for 45 minutes.  Walking 1  miles in 35 minutes.  Pushing a stroller 1  miles in 30 minutes.  Playing basketball for 30 minutes.  Raking leaves for 30 minutes.  Bicycling 5 miles in 30 minutes.  Walking 2 miles in 30 minutes.  Dancing for 30 minutes.  Shoveling [place] for 15 minutes.  Swimming laps for 20 minutes.  Walking up stairs for 15 minutes.  Bicycling 4 miles in 15 minutes.  Gardening for 30 to 45 minutes.  Jumping rope for 15 minutes.  Washing windows or floors for 45 to 60 minutes. Document Released: 05/14/2010 Document Revised: 07/04/2011 Document Reviewed: 05/14/2010 Exit[employer] Patient Information 2013 [employer].   Other topics ( that may be useful information):    Sexually Transmitted Disease Sexually transmitted disease (STD) refers to any infection that is passed from person to person during sexual activity. This may happen by way of saliva, semen, blood, vaginal mucus, or urine. Common STDs include:  Gonorrhea.  Chlamydia.  Syphilis.  HIV/AIDS.  Genital herpes.  Hepatitis B and C.  Trichomonas.  Human papillomavirus (HPV).  Pubic lice. CAUSES  An STD may be spread by bacteria, virus, or parasite. A person can get an STD by:  Sexual intercourse with an infected person.  Sharing sex toys with an infected person.  Sharing needles with an infected person.  Having intimate contact with the genitals, mouth, or rectal areas of an infected person. SYMPTOMS  Some people may not have any symptoms, but   they can still pass the infection to others. Different STDs have different symptoms. Symptoms include:  Painful or bloody urination.  Pain in the pelvis, abdomen, vagina, anus, throat, or eyes.  Skin rash, itching, irritation, growths, or sores (lesions). These usually occur in the genital or anal area.  Abnormal vaginal discharge.  Penile discharge in men.  Soft, flesh-colored skin growths in the  genital or anal area.  Fever.  Pain or bleeding during sexual intercourse.  Swollen glands in the groin area.  Yellow skin and eyes (jaundice). This is seen with hepatitis. DIAGNOSIS  To make a diagnosis, your caregiver may:  Take a medical history.  Perform a physical exam.  Take a specimen (culture) to be examined.  Examine a sample of discharge under a microscope.  Perform blood test TREATMENT   Chlamydia, gonorrhea, trichomonas, and syphilis can be cured with antibiotic medicine.  Genital herpes, hepatitis, and HIV can be treated, but not cured, with prescribed medicines. The medicines will lessen the symptoms.  Genital warts from HPV can be treated with medicine or by freezing, burning (electrocautery), or surgery. Warts may come back.  HPV is a virus and cannot be cured with medicine or surgery.However, abnormal areas may be followed very closely by your caregiver and may be removed from the cervix, vagina, or vulva through office procedures or surgery. If your diagnosis is confirmed, your recent sexual partners need treatment. This is true even if they are symptom-free or have a negative culture or evaluation. They should not have sex until their caregiver says it is okay. HOME CARE INSTRUCTIONS  All sexual partners should be informed, tested, and treated for all STDs.  Take your antibiotics as directed. Finish them even if you start to feel better.  Only take over-the-counter or prescription medicines for pain, discomfort, or fever as directed by your caregiver.  Rest.  Eat a balanced diet and drink enough fluids to keep your urine clear or pale yellow.  Do not have sex until treatment is completed and you have followed up with your caregiver. STDs should be checked after treatment.  Keep all follow-up appointments, Pap tests, and blood tests as directed by your caregiver.  Only use latex condoms and water-soluble lubricants during sexual activity. Do not use  petroleum jelly or oils.  Avoid alcohol and illegal drugs.  Get vaccinated for HPV and hepatitis. If you have not received these vaccines in the past, talk to your caregiver about whether one or both might be right for you.  Avoid risky sex practices that can break the skin. The only way to avoid getting an STD is to avoid all sexual activity.Latex condoms and dental dams (for oral sex) will help lessen the risk of getting an STD, but will not completely eliminate the risk. SEEK MEDICAL CARE IF:   You have a fever.  You have any new or worsening symptoms. Document Released: 07/02/2002 Document Revised: 07/04/2011 Document Reviewed: 07/09/2010 [facility] Patient Information 2013 [employer].    Domestic Abuse You are being battered or abused if someone close to you hits, pushes, or physically hurts you in any way. You also are being abused if you are forced into activities. You are being sexually abused if you are forced to have sexual contact of any kind. You are being emotionally abused if you are made to feel worthless or if you are constantly threatened. It is important to remember that help is available. No one has the right to abuse you. PREVENTION OF FURTHER  ABUSE  Learn the warning signs of danger. This varies with situations but may include: the use of alcohol, threats, isolation from friends and family, or forced sexual contact. Leave if you feel that violence is going to occur.  If you are attacked or beaten, report it to the police so the abuse is documented. You do not have to press charges. The police can protect you while you or the attackers are leaving. Get the officer's name and badge number and a copy of the report.  Find someone you can trust and tell them what is happening to you: your caregiver, a nurse, clergy member, close friend or family member. Feeling ashamed is natural, but remember that you have done nothing wrong. No one deserves abuse. Document Released:  04/08/2000 Document Revised: 07/04/2011 Document Reviewed: 06/17/2010 Exit[employer] Patient Information 2013 [employer].    How Much is Too Much Alcohol? Drinking too much alcohol can cause injury, accidents, and health problems. These types of problems can include:   Car crashes.  Falls.  Family fighting (domestic violence).  Drowning.  Fights.  Injuries.  Burns.  Damage to certain organs.  Having a baby with birth defects. ONE DRINK CAN BE TOO MUCH WHEN YOU ARE:  Working.  Pregnant or breastfeeding.  Taking medicines. Ask your doctor.  Driving or planning to drive. If you or someone you know has a drinking problem, get help from a doctor.  Document Released: 02/05/2009 Document Revised: 07/04/2011 Document Reviewed: 02/05/2009 ExitCare Patient Information 2013 [employer].   Smoking Hazards Smoking cigarettes is extremely bad for your health. Tobacco smoke has over 200 known poisons in it. There are over 60 chemicals in tobacco smoke that cause cancer. Some of the chemicals found in cigarette smoke include:   Cyanide.  Benzene.  Formaldehyde.  Methanol (wood alcohol).  Acetylene (fuel used in welding torches).  Ammonia. Cigarette smoke also contains the poisonous gases nitrogen oxide and carbon monoxide.  Cigarette smokers have an increased risk of many serious medical problems and Smoking causes approximately:  90% of all lung cancer deaths in men.  80% of all lung cancer deaths in women.  90% of deaths from chronic obstructive lung disease. Compared with nonsmokers, smoking increases the risk of:  Coronary heart disease by 2 to 4 times.  Stroke by 2 to 4 times.  Men developing lung cancer by 23 times.  Women developing lung cancer by 13 times.  Dying from chronic obstructive lung diseases by 12 times.  . Smoking is the most preventable cause of death and disease in our society.  WHY IS SMOKING ADDICTIVE?  Nicotine is the chemical  agent in tobacco that is capable of causing addiction or dependence.  When you smoke and inhale, nicotine is absorbed rapidly into the bloodstream through your lungs. Nicotine absorbed through the lungs is capable of creating a powerful addiction. Both inhaled and non-inhaled nicotine may be addictive.  Addiction studies of cigarettes and spit tobacco show that addiction to nicotine occurs mainly during the teen years, when young people begin using tobacco products. WHAT ARE THE BENEFITS OF QUITTING?  There are many health benefits to quitting smoking.   Likelihood of developing cancer and heart disease decreases. Health improvements are seen almost immediately.  Blood pressure, pulse rate, and breathing patterns start returning to normal soon after quitting. QUITTING SMOKING   American Lung Association - 1-800-LUNGUSA  American Cancer Society - 1-800-ACS-2345 Document Released: 05/19/2004 Document Revised: 07/04/2011 Document Reviewed: 01/21/2009 ExitCare Patient Information 2013 [employer].   Stress Management Stress is a state of physical or mental tension that often results from changes in your life or normal routine. Some common causes of stress are:  Death of a loved one.  Injuries or severe illnesses.  Getting fired or changing jobs.  Moving into a new home. Other causes may be:  Sexual problems.  Business or financial losses.  Taking on a large debt.  Regular conflict with someone at home or at work.  Constant tiredness from lack of sleep. It is not just bad things that are stressful. It may be stressful to:  Win the lottery.  Get married.  Buy a new car. The amount of stress that can be easily tolerated varies from person to person. Changes generally cause stress, regardless of the types of change. Too much stress can affect your health. It may lead to physical or emotional problems. Too little stress (boredom) may also become stressful. SUGGESTIONS TO  REDUCE STRESS:  Talk things over with your family and friends. It often is helpful to share your concerns and worries. If you feel your problem is serious, you may want to get help from a professional counselor.  Consider your problems one at a time instead of lumping them all together. Trying to take care of everything at once may seem impossible. List all the things you need to do and then start with the most important one. Set a goal to accomplish 2 or 3 things each day. If you expect to do too many in a single day you will naturally fail, causing you to feel even more stressed.  Do not use alcohol or drugs to relieve stress. Although you may feel better for a short time, they do not remove the problems that caused the stress. They can also be habit forming.  Exercise regularly - at least 3 times per week. Physical exercise can help to relieve that "uptight" feeling and will relax you.  The shortest distance between despair and hope is often a good night's sleep.  Go to bed and get up on time allowing yourself time for appointments without being rushed.  Take a short "time-out" period from any stressful situation that occurs during the day. Close your eyes and take some deep breaths. Starting with the muscles in your face, tense them, hold it for a few seconds, then relax. Repeat this with the muscles in your neck, shoulders, hand, stomach, back and legs.  Take good care of yourself. Eat a balanced diet and get plenty of rest.  Schedule time for having fun. Take a break from your daily routine to relax. HOME CARE INSTRUCTIONS   Call if you feel overwhelmed by your problems and feel you can no longer manage them on your own.  Return immediately if you feel like hurting yourself or someone else. Document Released: 10/05/2000 Document Revised: 07/04/2011 Document Reviewed: 05/28/2007 ExitCare Patient Information 2013 [employer].   

## 2016-11-30 LAB — TSH: TSH: 3.27 u[IU]/mL (ref 0.450–4.500)

## 2016-11-30 LAB — HEPATITIS C ANTIBODY

## 2016-11-30 LAB — HIV ANTIBODY (ROUTINE TESTING W REFLEX): HIV Screen 4th Generation wRfx: NONREACTIVE

## 2016-11-30 LAB — RPR: RPR: NONREACTIVE

## 2016-12-01 LAB — CYTOLOGY - PAP
BACTERIAL VAGINITIS: POSITIVE — AB
CANDIDA VAGINITIS: NEGATIVE
Chlamydia: NEGATIVE
Diagnosis: NEGATIVE
HPV: NOT DETECTED
Neisseria Gonorrhea: NEGATIVE
Trichomonas: NEGATIVE

## 2016-12-06 ENCOUNTER — Other Ambulatory Visit: Payer: Self-pay

## 2016-12-06 MED ORDER — METRONIDAZOLE 500 MG PO TABS: 500.0000 mg | ORAL_TABLET | Freq: Two times a day (BID) | ORAL | 0 refills | Status: AC

## 2017-08-22 ENCOUNTER — Telehealth: Payer: Self-pay | Admitting: Obstetrics and Gynecology

## 2017-08-22 NOTE — Telephone Encounter (Signed)
Patient is asking to talk with Dr.Jertson's nurse. Patient did not give any details. Patient states this is not urgent.

## 2017-08-22 NOTE — Telephone Encounter (Signed)
Spoke with patient. Patient states that she has a history of chronic BV. Reports Dr.Jertson prescribed her Metrogel to use 1-2 times after her menses to prevent recurring BV. Asking for a refill on Metrogel. Patient has moved and is unable to come in for an OV. Also unsure about her insurance. Advised will review with front office supervisor as well.  Dr.Jertson, please advise.

## 2017-08-22 NOTE — Telephone Encounter (Signed)
She will need to be seen, either here or wherever she is living now for evaluation

## 2017-08-23 NOTE — Telephone Encounter (Signed)
Left detailed message advising patient she will need to be seen for further evaluation in our office or with a provider locally where she is living. Advised to return call to office for scheduling.

## 2017-08-23 NOTE — Telephone Encounter (Signed)
Spoke with patient. Advised as seen below per Dr. Oscar La. Patient declines to schedule OV at this time due to no insurance. Denies any current symptoms. Patient aware to return call to office to schedule OV.   Routing to provider for final review. Patient is agreeable to disposition. Will close encounter.

## 2017-08-23 NOTE — Telephone Encounter (Signed)
Patient returned call to Kaitlyn. °

## 2018-02-16 ENCOUNTER — Other Ambulatory Visit: Payer: Self-pay | Admitting: Orthopaedic Surgery

## 2018-02-16 DIAGNOSIS — S46811A Strain of other muscles, fascia and tendons at shoulder and upper arm level, right arm, initial encounter: Secondary | ICD-10-CM

## 2018-02-16 DIAGNOSIS — M25511 Pain in right shoulder: Secondary | ICD-10-CM

## 2018-03-01 ENCOUNTER — Ambulatory Visit
Admission: RE | Admit: 2018-03-01 | Discharge: 2018-03-01 | Disposition: A | Discharge status: Home / Self Care | Payer: Worker's Compensation | Source: Ambulatory Visit | Attending: Orthopaedic Surgery | Admitting: Orthopaedic Surgery

## 2018-03-01 ENCOUNTER — Ambulatory Visit
Admission: RE | Admit: 2018-03-01 | Discharge: 2018-03-01 | Disposition: A | Discharge status: Home / Self Care | Payer: BLUE CROSS/BLUE SHIELD | Source: Ambulatory Visit | Attending: Orthopaedic Surgery | Admitting: Orthopaedic Surgery

## 2018-03-01 DIAGNOSIS — M25511 Pain in right shoulder: Secondary | ICD-10-CM

## 2018-03-01 DIAGNOSIS — S46811A Strain of other muscles, fascia and tendons at shoulder and upper arm level, right arm, initial encounter: Secondary | ICD-10-CM

## 2018-03-01 MED ORDER — IOPAMIDOL (ISOVUE-M 200) INJECTION 41%
12.0000 mL | Freq: Once | INTRAMUSCULAR | Status: AC
  Administered 2018-03-01: 12 mL via INTRA_ARTICULAR

## 2019-07-08 ENCOUNTER — Encounter: Payer: Self-pay | Admitting: Certified Nurse Midwife

## 2019-07-10 ENCOUNTER — Encounter: Payer: Self-pay | Admitting: Certified Nurse Midwife

## 2019-08-16 DIAGNOSIS — L551 Sunburn of second degree: Secondary | ICD-10-CM | POA: Diagnosis not present

## 2019-10-02 DIAGNOSIS — Z113 Encounter for screening for infections with a predominantly sexual mode of transmission: Secondary | ICD-10-CM | POA: Diagnosis not present

## 2019-10-30 DIAGNOSIS — F39 Unspecified mood [affective] disorder: Secondary | ICD-10-CM | POA: Diagnosis not present

## 2019-10-30 DIAGNOSIS — B002 Herpesviral gingivostomatitis and pharyngotonsillitis: Secondary | ICD-10-CM | POA: Diagnosis not present

## 2019-10-30 DIAGNOSIS — M25511 Pain in right shoulder: Secondary | ICD-10-CM | POA: Diagnosis not present

## 2019-10-30 DIAGNOSIS — F900 Attention-deficit hyperactivity disorder, predominantly inattentive type: Secondary | ICD-10-CM | POA: Diagnosis not present

## 2020-01-13 DIAGNOSIS — Z6839 Body mass index (BMI) 39.0-39.9, adult: Secondary | ICD-10-CM | POA: Diagnosis not present

## 2020-01-13 DIAGNOSIS — N912 Amenorrhea, unspecified: Secondary | ICD-10-CM | POA: Diagnosis not present

## 2020-02-03 IMAGING — XA DG FLUORO GUIDE NDL PLC/BX
2 series · 2 of 2 positions shown · non-contrast
Comparison: none

CLINICAL DATA: Right shoulder pain.

[Series 1: ortho standard · 1 of 1 slices shown (1 of 2)]
[im 1/1]
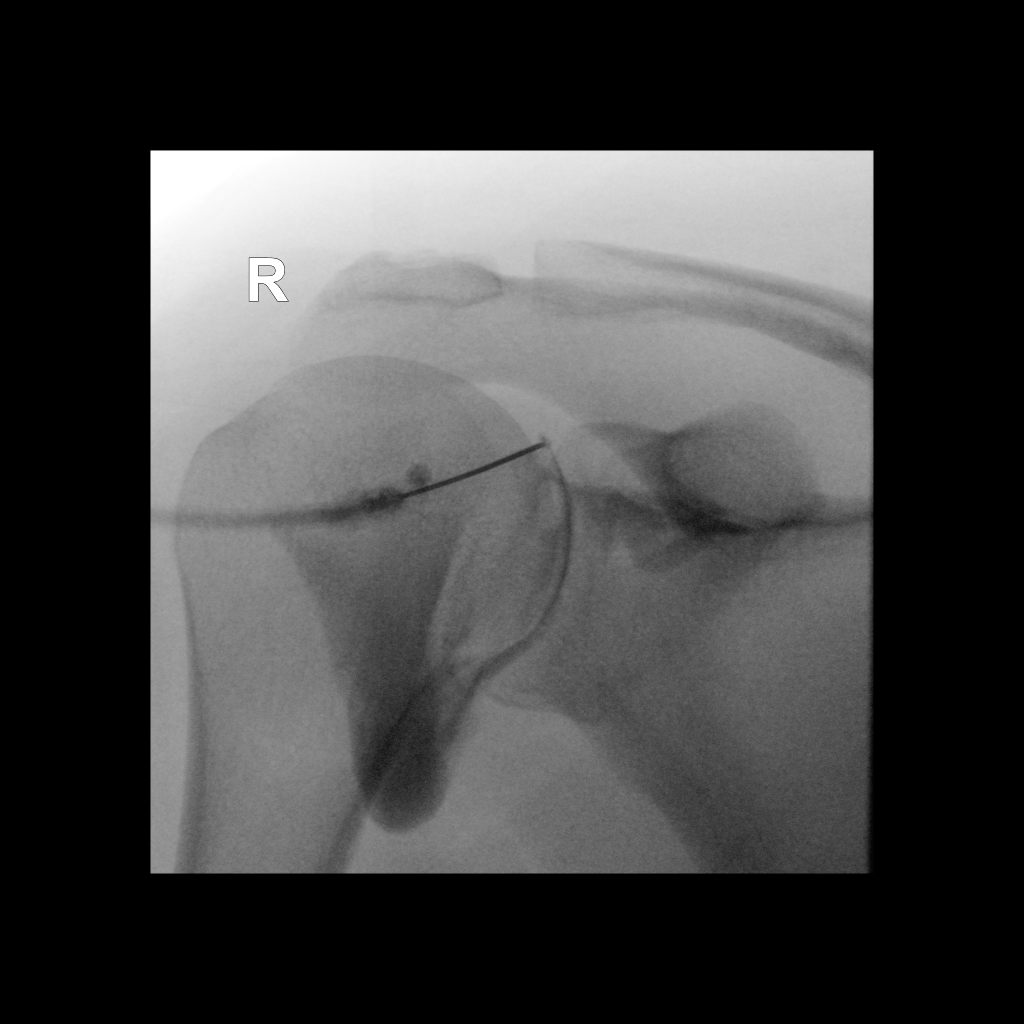

[Series 2: ortho standard · 1 of 1 slices shown (2 of 2)]
[im 1/1]
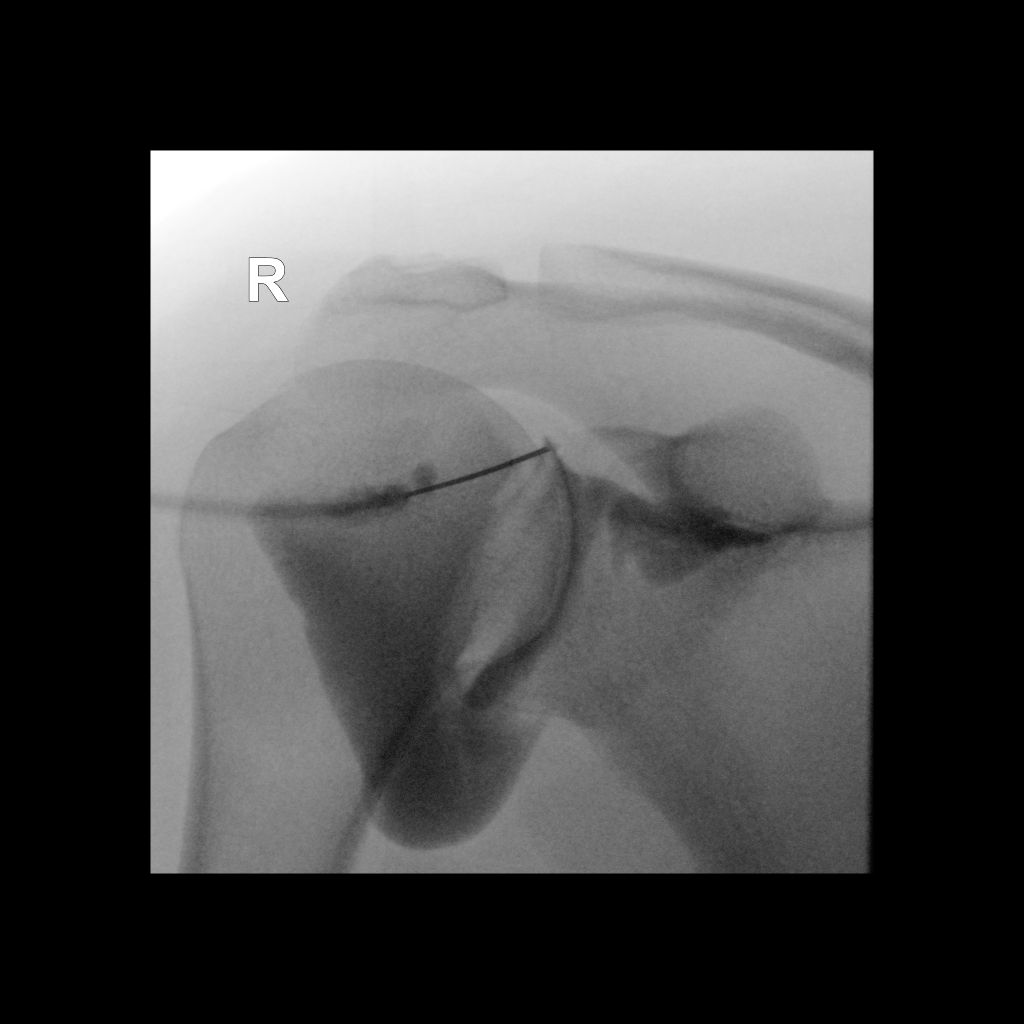

[2 of 2 positions shown; findings below may reference images not displayed]

FLUOROSCOPY TIME:  Radiation Exposure Index (as provided by the
fluoroscopic device): 0.5 mGy

Fluoroscopy Time:  Less than 1 second

Number of Acquired Images:  0

PROCEDURE:
The risks and benefits of the procedure were discussed with the
patient, and written informed consent was obtained. The patient
stated no history of allergy to contrast media. A formal timeout
procedure was performed with the patient according to departmental
protocol.

The patient was placed supine on the fluoroscopy table and the right
glenohumeral joint was identified under fluoroscopy. The skin
overlying the right glenohumeral joint was subsequently cleaned with
Betadine and a sterile drape was placed over the area of interest. 2
ml 1% Lidocaine was used to anesthetize the skin around the needle
insertion site.

A 3.5 inch 22 gauge spinal needle was inserted into the right
glenohumeral joint under fluoroscopy.

12 ml of gadolinium mixture (0.1 ml of Multihance mixed with 10 ml
of Isovue-M 200 contrast and 10 ml of sterile saline) was injected
into the right glenohumeral joint.

The needle was removed and hemostasis was achieved. The patient was
subsequently transferred to MRI for imaging.
IMPRESSION: Technically successful right shoulder injection for MRI.

## 2020-02-11 DIAGNOSIS — N912 Amenorrhea, unspecified: Secondary | ICD-10-CM | POA: Diagnosis not present

## 2020-03-18 DIAGNOSIS — N939 Abnormal uterine and vaginal bleeding, unspecified: Secondary | ICD-10-CM | POA: Diagnosis not present

## 2020-03-18 DIAGNOSIS — Z6841 Body Mass Index (BMI) 40.0 and over, adult: Secondary | ICD-10-CM | POA: Diagnosis not present

## 2020-04-08 DIAGNOSIS — N939 Abnormal uterine and vaginal bleeding, unspecified: Secondary | ICD-10-CM | POA: Diagnosis not present

## 2020-04-16 DIAGNOSIS — N939 Abnormal uterine and vaginal bleeding, unspecified: Secondary | ICD-10-CM | POA: Diagnosis not present

## 2020-06-25 DIAGNOSIS — F900 Attention-deficit hyperactivity disorder, predominantly inattentive type: Secondary | ICD-10-CM | POA: Diagnosis not present

## 2020-06-25 DIAGNOSIS — B002 Herpesviral gingivostomatitis and pharyngotonsillitis: Secondary | ICD-10-CM | POA: Diagnosis not present

## 2020-06-25 DIAGNOSIS — F39 Unspecified mood [affective] disorder: Secondary | ICD-10-CM | POA: Diagnosis not present

## 2020-06-25 DIAGNOSIS — M25511 Pain in right shoulder: Secondary | ICD-10-CM | POA: Diagnosis not present

## 2020-08-11 DIAGNOSIS — Z113 Encounter for screening for infections with a predominantly sexual mode of transmission: Secondary | ICD-10-CM | POA: Diagnosis not present

## 2020-08-11 DIAGNOSIS — N760 Acute vaginitis: Secondary | ICD-10-CM | POA: Diagnosis not present

## 2020-08-11 DIAGNOSIS — Z6839 Body mass index (BMI) 39.0-39.9, adult: Secondary | ICD-10-CM | POA: Diagnosis not present

## 2020-08-11 DIAGNOSIS — B9689 Other specified bacterial agents as the cause of diseases classified elsewhere: Secondary | ICD-10-CM | POA: Diagnosis not present

## 2020-08-19 DIAGNOSIS — A549 Gonococcal infection, unspecified: Secondary | ICD-10-CM | POA: Diagnosis not present

## 2020-08-19 DIAGNOSIS — Z113 Encounter for screening for infections with a predominantly sexual mode of transmission: Secondary | ICD-10-CM | POA: Diagnosis not present

## 2020-09-22 DIAGNOSIS — Z202 Contact with and (suspected) exposure to infections with a predominantly sexual mode of transmission: Secondary | ICD-10-CM | POA: Diagnosis not present

## 2020-09-22 DIAGNOSIS — Z6839 Body mass index (BMI) 39.0-39.9, adult: Secondary | ICD-10-CM | POA: Diagnosis not present

## 2020-09-22 DIAGNOSIS — Z3041 Encounter for surveillance of contraceptive pills: Secondary | ICD-10-CM | POA: Diagnosis not present

## 2020-09-22 DIAGNOSIS — Z01411 Encounter for gynecological examination (general) (routine) with abnormal findings: Secondary | ICD-10-CM | POA: Diagnosis not present

## 2021-01-08 DIAGNOSIS — F900 Attention-deficit hyperactivity disorder, predominantly inattentive type: Secondary | ICD-10-CM | POA: Diagnosis not present

## 2021-01-08 DIAGNOSIS — B002 Herpesviral gingivostomatitis and pharyngotonsillitis: Secondary | ICD-10-CM | POA: Diagnosis not present

## 2021-01-08 DIAGNOSIS — F39 Unspecified mood [affective] disorder: Secondary | ICD-10-CM | POA: Diagnosis not present

## 2021-03-11 DIAGNOSIS — Z113 Encounter for screening for infections with a predominantly sexual mode of transmission: Secondary | ICD-10-CM | POA: Diagnosis not present

## 2021-03-11 DIAGNOSIS — Z6837 Body mass index (BMI) 37.0-37.9, adult: Secondary | ICD-10-CM | POA: Diagnosis not present

## 2021-03-11 DIAGNOSIS — Z01411 Encounter for gynecological examination (general) (routine) with abnormal findings: Secondary | ICD-10-CM | POA: Diagnosis not present

## 2021-03-11 DIAGNOSIS — N6321 Unspecified lump in the left breast, upper outer quadrant: Secondary | ICD-10-CM | POA: Diagnosis not present

## 2021-08-12 DIAGNOSIS — B002 Herpesviral gingivostomatitis and pharyngotonsillitis: Secondary | ICD-10-CM | POA: Diagnosis not present

## 2021-08-12 DIAGNOSIS — F39 Unspecified mood [affective] disorder: Secondary | ICD-10-CM | POA: Diagnosis not present

## 2021-08-12 DIAGNOSIS — F900 Attention-deficit hyperactivity disorder, predominantly inattentive type: Secondary | ICD-10-CM | POA: Diagnosis not present

## 2021-08-23 DIAGNOSIS — B3731 Acute candidiasis of vulva and vagina: Secondary | ICD-10-CM | POA: Diagnosis not present

## 2021-12-23 DIAGNOSIS — F39 Unspecified mood [affective] disorder: Secondary | ICD-10-CM | POA: Diagnosis not present

## 2021-12-23 DIAGNOSIS — Z13220 Encounter for screening for lipoid disorders: Secondary | ICD-10-CM | POA: Diagnosis not present

## 2021-12-23 DIAGNOSIS — Z0000 Encounter for general adult medical examination without abnormal findings: Secondary | ICD-10-CM | POA: Diagnosis not present

## 2021-12-23 DIAGNOSIS — F900 Attention-deficit hyperactivity disorder, predominantly inattentive type: Secondary | ICD-10-CM | POA: Diagnosis not present

## 2021-12-23 DIAGNOSIS — B002 Herpesviral gingivostomatitis and pharyngotonsillitis: Secondary | ICD-10-CM | POA: Diagnosis not present

## 2022-02-24 DIAGNOSIS — B9689 Other specified bacterial agents as the cause of diseases classified elsewhere: Secondary | ICD-10-CM | POA: Diagnosis not present

## 2022-02-24 DIAGNOSIS — N760 Acute vaginitis: Secondary | ICD-10-CM | POA: Diagnosis not present

## 2022-05-13 DIAGNOSIS — E7800 Pure hypercholesterolemia, unspecified: Secondary | ICD-10-CM | POA: Diagnosis not present

## 2022-05-13 DIAGNOSIS — Z130 Encounter for screening for diseases of the blood and blood-forming organs and certain disorders involving the immune mechanism: Secondary | ICD-10-CM | POA: Diagnosis not present

## 2022-05-13 DIAGNOSIS — Z113 Encounter for screening for infections with a predominantly sexual mode of transmission: Secondary | ICD-10-CM | POA: Diagnosis not present

## 2022-05-13 DIAGNOSIS — Z01419 Encounter for gynecological examination (general) (routine) without abnormal findings: Secondary | ICD-10-CM | POA: Diagnosis not present

## 2022-05-23 DIAGNOSIS — N6321 Unspecified lump in the left breast, upper outer quadrant: Secondary | ICD-10-CM | POA: Diagnosis not present

## 2022-05-23 DIAGNOSIS — R928 Other abnormal and inconclusive findings on diagnostic imaging of breast: Secondary | ICD-10-CM | POA: Diagnosis not present

## 2022-06-20 DIAGNOSIS — N939 Abnormal uterine and vaginal bleeding, unspecified: Secondary | ICD-10-CM | POA: Diagnosis not present

## 2022-06-24 DIAGNOSIS — F900 Attention-deficit hyperactivity disorder, predominantly inattentive type: Secondary | ICD-10-CM | POA: Diagnosis not present

## 2022-06-24 DIAGNOSIS — E7800 Pure hypercholesterolemia, unspecified: Secondary | ICD-10-CM | POA: Diagnosis not present

## 2022-06-24 DIAGNOSIS — F39 Unspecified mood [affective] disorder: Secondary | ICD-10-CM | POA: Diagnosis not present

## 2023-01-17 DIAGNOSIS — E7800 Pure hypercholesterolemia, unspecified: Secondary | ICD-10-CM | POA: Diagnosis not present

## 2023-01-17 DIAGNOSIS — F900 Attention-deficit hyperactivity disorder, predominantly inattentive type: Secondary | ICD-10-CM | POA: Diagnosis not present

## 2023-01-17 DIAGNOSIS — F39 Unspecified mood [affective] disorder: Secondary | ICD-10-CM | POA: Diagnosis not present

## 2023-01-17 DIAGNOSIS — Z113 Encounter for screening for infections with a predominantly sexual mode of transmission: Secondary | ICD-10-CM | POA: Diagnosis not present

## 2023-01-17 DIAGNOSIS — Z0000 Encounter for general adult medical examination without abnormal findings: Secondary | ICD-10-CM | POA: Diagnosis not present

## 2023-01-17 DIAGNOSIS — B002 Herpesviral gingivostomatitis and pharyngotonsillitis: Secondary | ICD-10-CM | POA: Diagnosis not present

## 2023-07-13 DIAGNOSIS — F481 Depersonalization-derealization syndrome: Secondary | ICD-10-CM | POA: Diagnosis not present

## 2023-07-13 DIAGNOSIS — F900 Attention-deficit hyperactivity disorder, predominantly inattentive type: Secondary | ICD-10-CM | POA: Diagnosis not present

## 2023-07-13 DIAGNOSIS — F39 Unspecified mood [affective] disorder: Secondary | ICD-10-CM | POA: Diagnosis not present
